# Patient Record
Sex: Female | Born: 1960 | Hispanic: Yes | Smoking: Never smoker
Health system: Southern US, Community
[De-identification: ages and names within clinical notes are randomized; demographics above are authoritative.]

## PROBLEM LIST (undated history)

## (undated) DIAGNOSIS — M545 Low back pain, unspecified: Secondary | ICD-10-CM

## (undated) DIAGNOSIS — N6310 Unspecified lump in the right breast, unspecified quadrant: Secondary | ICD-10-CM

## (undated) DIAGNOSIS — S52629A Torus fracture of lower end of unspecified ulna, initial encounter for closed fracture: Secondary | ICD-10-CM

## (undated) DIAGNOSIS — R7989 Other specified abnormal findings of blood chemistry: Secondary | ICD-10-CM

## (undated) DIAGNOSIS — Z860100 Personal history of colon polyps, unspecified: Secondary | ICD-10-CM

## (undated) DIAGNOSIS — K2970 Gastritis, unspecified, without bleeding: Secondary | ICD-10-CM

## (undated) DIAGNOSIS — J45909 Unspecified asthma, uncomplicated: Secondary | ICD-10-CM

## (undated) DIAGNOSIS — S52529A Torus fracture of lower end of unspecified radius, initial encounter for closed fracture: Secondary | ICD-10-CM

## (undated) DIAGNOSIS — K219 Gastro-esophageal reflux disease without esophagitis: Secondary | ICD-10-CM

## (undated) DIAGNOSIS — A609 Anogenital herpesviral infection, unspecified: Secondary | ICD-10-CM

## (undated) HISTORY — DX: Personal history of colon polyps, unspecified: Z86.0100

## (undated) HISTORY — DX: Gastritis, unspecified, without bleeding: K29.70

## (undated) HISTORY — DX: Gastro-esophageal reflux disease without esophagitis: K21.9

## (undated) HISTORY — DX: Torus fracture of lower end of unspecified radius, initial encounter for closed fracture: S52.529A

## (undated) HISTORY — DX: Torus fracture of lower end of unspecified ulna, initial encounter for closed fracture: S52.629A

## (undated) HISTORY — DX: Other specified abnormal findings of blood chemistry: R79.89

## (undated) HISTORY — DX: Personal history of colonic polyps: Z86.010

## (undated) HISTORY — DX: Unspecified asthma, uncomplicated: J45.909

## (undated) HISTORY — DX: Anogenital herpesviral infection, unspecified: A60.9

---

## 2005-12-27 ENCOUNTER — Ambulatory Visit: Payer: Self-pay

## 2006-08-22 ENCOUNTER — Emergency Department: Payer: Self-pay | Admitting: Emergency Medicine

## 2007-05-23 HISTORY — PX: BACK SURGERY: SHX140

## 2008-03-03 ENCOUNTER — Ambulatory Visit: Payer: Self-pay

## 2009-12-07 ENCOUNTER — Ambulatory Visit: Payer: Self-pay

## 2010-05-22 HISTORY — PX: CHOLECYSTECTOMY: SHX55

## 2011-02-22 ENCOUNTER — Ambulatory Visit: Payer: Self-pay

## 2011-04-15 ENCOUNTER — Emergency Department: Payer: Self-pay | Admitting: Emergency Medicine

## 2011-09-08 ENCOUNTER — Emergency Department: Payer: Self-pay | Admitting: Internal Medicine

## 2011-09-08 LAB — BASIC METABOLIC PANEL
Anion Gap: 10 (ref 7–16)
Calcium, Total: 8.7 mg/dL (ref 8.5–10.1)
Chloride: 104 mmol/L (ref 98–107)
Creatinine: 0.78 mg/dL (ref 0.60–1.30)
Glucose: 117 mg/dL — ABNORMAL HIGH (ref 65–99)
Osmolality: 281 (ref 275–301)
Potassium: 3.5 mmol/L (ref 3.5–5.1)

## 2011-09-08 LAB — CBC
HCT: 40.8 % (ref 35.0–47.0)
MCH: 29.1 pg (ref 26.0–34.0)
MCHC: 32.6 g/dL (ref 32.0–36.0)
MCV: 89 fL (ref 80–100)
RBC: 4.57 10*6/uL (ref 3.80–5.20)
RDW: 12.7 % (ref 11.5–14.5)
WBC: 12.1 10*3/uL — ABNORMAL HIGH (ref 3.6–11.0)

## 2011-09-08 LAB — URINALYSIS, COMPLETE
Bilirubin,UR: NEGATIVE
Glucose,UR: NEGATIVE mg/dL (ref 0–75)
Ph: 7 (ref 4.5–8.0)
Squamous Epithelial: 1

## 2011-09-10 LAB — URINE CULTURE

## 2011-09-23 ENCOUNTER — Emergency Department: Payer: Self-pay | Admitting: Emergency Medicine

## 2011-09-23 LAB — CBC
HCT: 40.8 % (ref 35.0–47.0)
HGB: 13.6 g/dL (ref 12.0–16.0)
MCH: 30.4 pg (ref 26.0–34.0)
MCV: 92 fL (ref 80–100)
Platelet: 276 10*3/uL (ref 150–440)
RDW: 13.4 % (ref 11.5–14.5)

## 2011-09-23 LAB — COMPREHENSIVE METABOLIC PANEL
Albumin: 3.6 g/dL (ref 3.4–5.0)
Anion Gap: 4 — ABNORMAL LOW (ref 7–16)
BUN: 11 mg/dL (ref 7–18)
Bilirubin,Total: 0.3 mg/dL (ref 0.2–1.0)
Chloride: 106 mmol/L (ref 98–107)
Co2: 29 mmol/L (ref 21–32)
Creatinine: 0.68 mg/dL (ref 0.60–1.30)
EGFR (African American): >60
Glucose: 103 mg/dL — ABNORMAL HIGH (ref 65–99)
Osmolality: 277 (ref 275–301)
Potassium: 4.6 mmol/L (ref 3.5–5.1)
SGPT (ALT): 24 U/L
Sodium: 139 mmol/L (ref 136–145)
Total Protein: 8.2 g/dL (ref 6.4–8.2)

## 2011-09-23 LAB — URINALYSIS, COMPLETE
Bilirubin,UR: NEGATIVE
Glucose,UR: NEGATIVE mg/dL (ref 0–75)
Nitrite: NEGATIVE
Protein: NEGATIVE
Squamous Epithelial: NONE SEEN

## 2011-09-23 LAB — PREGNANCY, URINE: Pregnancy Test, Urine: NEGATIVE m[IU]/mL

## 2012-05-22 HISTORY — PX: COLONOSCOPY: SHX174

## 2013-03-19 ENCOUNTER — Ambulatory Visit: Payer: Self-pay

## 2013-05-22 DIAGNOSIS — R7989 Other specified abnormal findings of blood chemistry: Secondary | ICD-10-CM

## 2013-05-22 DIAGNOSIS — J45909 Unspecified asthma, uncomplicated: Secondary | ICD-10-CM

## 2013-05-22 HISTORY — DX: Other specified abnormal findings of blood chemistry: R79.89

## 2013-05-22 HISTORY — DX: Unspecified asthma, uncomplicated: J45.909

## 2013-08-03 ENCOUNTER — Emergency Department: Payer: Self-pay | Admitting: Emergency Medicine

## 2014-01-20 DIAGNOSIS — K2970 Gastritis, unspecified, without bleeding: Secondary | ICD-10-CM

## 2014-01-20 HISTORY — DX: Gastritis, unspecified, without bleeding: K29.70

## 2014-01-20 HISTORY — PX: UPPER GI ENDOSCOPY: SHX6162

## 2014-07-01 ENCOUNTER — Ambulatory Visit: Payer: Self-pay

## 2014-07-14 ENCOUNTER — Ambulatory Visit: Payer: Self-pay

## 2014-08-17 ENCOUNTER — Encounter: Payer: Self-pay | Admitting: *Deleted

## 2014-08-27 ENCOUNTER — Ambulatory Visit (INDEPENDENT_AMBULATORY_CARE_PROVIDER_SITE_OTHER): Payer: PRIVATE HEALTH INSURANCE | Admitting: General Surgery

## 2014-08-27 ENCOUNTER — Encounter: Payer: Self-pay | Admitting: General Surgery

## 2014-08-27 VITALS — BP 126/70 | HR 62 | Resp 12 | Ht 62.0 in | Wt 145.0 lb

## 2014-08-27 DIAGNOSIS — R0789 Other chest pain: Secondary | ICD-10-CM

## 2014-08-27 NOTE — Patient Instructions (Signed)
Aleve 2 tablets twice a day for 2 weeks and Prilosec daily. Autoexamen de [employer]  Chief Executive Officer(Breast Self-Awareness) El autoexamen de mama le permite advertir problemas en la mama con anticipacin, cuando todava son pequeos. Haga un autoexamen de las mamas:   Sprint Nextel Corporationodos los meses, 5 a 7 [address] despus de tener el perodo (Cokedalemenstruacin).  En la misma altura de cada mes si ya no tiene ms su perodo. Busque:  Diferencias entre los senos (tamao, forma o posicin).  Cambios en el tamao o forma del seno.  Sale sangre o lquido por los pezones.  Modificaciones en los pezones (hoyuelos, movimiento del pezn).   Cambio en el color o la textura de la piel (enrojecimiento, reas escamosas). Trate de palpar:   Engrosamientos.  Bultos.  Hoyuelos.  Cualquier otro cambio. CMO REALIZAR EL AUTOEXAMEN DE MAMAS  Observe sus senos y pezones.  1. Qutese toda la ropa por encima de la cintura. 2. Prese frente a un espejo en una habitacin con buena iluminacin. 3.  Ponga las Rockwell Automationmanos en las caderas y [address]je las manos Kellh[place] abajo. Palpe las mamas.  1. Acustese boca arriba o de pie en la ducha o la baera. Si est en la ducha o baera, moje y enjabone sus manos. 2. Coloque el brazo derecho por encima de la cabeza. 3. Coloque la mano izquierda en el rea de la axila derecha. 4. Haga pequeos crculos con las yemas de los dedos (no las puntas) de los 3 dedos medios. Presione ligeramente y luego haga una presin [place] y [place]. 5. Mueva los dedos un poco ms bajo y haga pequeos crculos en 3 presiones (ligera, media y Georgefirme). 6. Siga moviendo los dedos hacia abajo y haciendo crculos [employer] fast streamerhasta llegar a la parte inferior de la mama. 7. Mueva sus dedos de a un dedo de ancho hacia el centro de su cuerpo. 8. Contine haciendo crculos, esta vez hacia arriba hasta llegar a la parte inferior del cuello. 9. Mueva sus dedos de a un dedo de ancho hacia el centro de su cuerpo. 10. Haga crculos hacia abajo a partir de la  parte inferior del cuello. Haga crculos hacia arriba a partir de la parte inferior de la [place] Cornermama. Detngase cuando llegue a la mitad del pecho. 11.  Repita estos pasos en la otra mama. Escriba lo que observe y se sienta normal para cada mama. Tambin anote cualquier cambio que note.  SOLICITE AYUDA DE INMEDIATO SI:   Observa cambios en las mamas o los pezones.  Observa cambios en la piel.  Tiene una secrecin anormal en los pezones.  Usted tiene un bulto nuevo.  Tiene zonas con endurecimientos inusuales. Document Released: 06/10/2010 Document Revised: 04/24/2012 [facility] Patient Information 2015 [place], [place]. This information is not intended to replace advice given to you by your health care provider. Make sure you discuss any questions you have with your health care provider.

## 2014-08-27 NOTE — Progress Notes (Signed)
Patient ID: Victoria RoofRosa Szafran, female   DOB: Oct 20, 1960, 54 y.o.   MRN: [id number]  Chief Complaint  Patient presents with  . Breast Problem    right breast    HPI Victoria Lee is a 54 y.o. female.  who presents for a breast evaluation. The most recent mammogram and ultrasound was done on 07/14/14. She states she has felt a little knot in the lateral right breast since 2003. She does admit to right breast pain, near sternum so much that she avoids wearing a bra. She does not think it has gotten larger over the years. The pain does come and go. 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 Patient does perform regular self breast checks and gets regular mammograms done.   No family history of breast cancer. Interpreter, Annice PihJackie, present for interview, exam and discussion.  HPI  Past Medical History  Diagnosis Date  . [facility]ast[facility] Sept 2015    [facility]    Past Surgical History  Procedure Laterality Date  . Back surgery  2009  . Cholecystectomy  2012  . Colonoscopy  2014  . Upper gi endoscopy  Sept 2015    No family history on file.  Social History History  Substance Use Topics  . Smoking status: Never Smoker   . Smokeless tobacco: Never Used  . Alcohol Use: No    Allergies  Allergen Reactions  . Latex Itching    No current outpatient prescriptions on file.   No current facility-administered medications for this visit.    Review of Systems Review of Systems  Constitutional: Negative.   Respiratory: Positive for cough.   Cardiovascular: Negative.     Blood pressure 126/70, pulse 62, resp. rate 12, height 5\' 2"  (1.575 m), weight 145 lb (65.772 kg).  Physical Exam Physical Exam  Constitutional: She is oriented to person, place, and time. She appears well-developed and well-nourished.  Neck: Neck supple.  Cardiovascular: Normal rate, regular rhythm and normal heart sounds.   Pulmonary/Chest: Effort normal and breath sounds normal. Right breast exhibits tenderness.  Right breast exhibits no inverted nipple, no mass, no nipple discharge and no skin change. Left breast exhibits no inverted nipple, no mass, no nipple discharge, no skin change and no tenderness.    Tenderness over T6 costochondral junction.   Lymphadenopathy:    She has no cervical adenopathy.    She has no axillary adenopathy.  Neurological: She is alert and oriented to person, place, and time.  Skin: Skin is warm and dry.    Data Reviewed 12/30/2009 note for evaluation of breast and axillary pain reviewed.  Mammograms and right breast ultrasound dated 07/14/2014 were reviewed. BI-RADS-1.  Assessment    Benign breast exam. Focal fibrosis/adipose tissue in the lower outer quadrant of the right breast. Chest wall tenderness from a musculoskeletal source.    Plan    The patient reported that she had had a gastric ulcer last year. Review of [facility] endoscopy report report was notable for mild gastritis. No evidence of H. pylori infection. I think she will tolerate a short course of anti-inflammatories if she makes use of Prilosec with this medication. Local heat application was encouraged.    Aleve 2 tablets twice a day for 2 weeks and Priolsec daily.  PCP:  None Ref: Dr. Gaylene Brookshoksi  Juanito Gonyer, Merrily PewJeffrey W 08/28/2014, 9:07 PM

## 2014-08-28 DIAGNOSIS — R0789 Other chest pain: Secondary | ICD-10-CM | POA: Insufficient documentation

## 2018-02-19 ENCOUNTER — Ambulatory Visit: Payer: Self-pay

## 2018-02-28 ENCOUNTER — Ambulatory Visit: Payer: Self-pay

## 2018-03-28 ENCOUNTER — Inpatient Hospital Stay: Attending: Medical

## 2018-03-28 ENCOUNTER — Ambulatory Visit: Admit: 2018-03-28 | Discharge: 2018-03-28 | Attending: Medical

## 2018-03-28 DIAGNOSIS — M25562 Pain in left knee: Secondary | ICD-10-CM

## 2018-03-28 DIAGNOSIS — G8929 Other chronic pain: Secondary | ICD-10-CM

## 2018-03-28 MED ORDER — NAPROXEN 500 MG PO TABS
500 MG | ORAL_TABLET | Freq: Two times a day (BID) | ORAL | 1 refills | Status: AC
Start: 2018-03-28 — End: ?

## 2018-03-28 NOTE — Other (Unsigned)
Patient Acct Nbr: [id number]   Primary AUTH/CERT:   Primary Insurance Company Name: Self Pay  Primary Insurance Plan name: Self Pay  Primary Insurance Group Number:   Primary Insurance Plan Type: Health  Primary Insurance Policy Number:

## 2018-03-28 NOTE — Progress Notes (Signed)
HPI:    Kristina [name] is a 57 y.o. female who presents to Establish. States general health is good. She does not take chronic medications.  Moved from [place] a few months ago, and is living with her daughter.  We are utilizing the [employer] system to communicate.    CC chronic L knee pain x 1 yr with progressively worsening x 3 months. Twisted knee a yr ago while standing on a piece of plastic to wash a window. + swelling noted after injury. Saw physician who advised crutches, rest, medication.She has had intermittent pain/ sensation of buckling since. Feels a bulge at posterior popliteal space.  Denies re injury, over use. 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 She has not worked for 3 weeks and knee sill bothering her. Stair climbing exacerbates pain, feels rubbing grinding sensation over patella    Chief Complaint   Patient presents with   ??? Establish Care     pt needing PCP   ??? Knee Pain     pt is having left knee pain on going 2 months, pt fell on concrete and injured her knee.    ??? Flu Vaccine     denies flu shot        HPI  Subjective:   Review of Systems   Musculoskeletal: Positive for arthralgias ( L knee) and gait problem ( limping, has pain walking stairs, has to hold on to banister). Negative for back pain and joint swelling.        [name] is a 57 y.o. female with the following history as recorded in EpicCare:  There are no active problems to display for this patient.    Current Outpatient Medications   Medication Sig Dispense Refill   ??? naproxen (NAPROSYN) 500 MG tablet Take 1 tablet by mouth 2 times daily (with meals) 60 tablet 1     No current facility-administered medications for this visit.      Allergies: Patient has no known allergies.  History reviewed. No pertinent past medical history.  Past Surgical History:   Procedure Laterality Date   ??? BACK SURGERY  2009   ??? CHOLECYSTECTOMY     ??? HERNIA REPAIR       Family History   Problem Relation Age of  Onset   ??? Other Mother      Social History     Tobacco Use   ??? Smoking status: Never Smoker   ??? Smokeless tobacco: Never Used   Substance Use Topics   ??? Alcohol use: Never     Frequency: Never       Objective:   Physical Exam   Constitutional: She appears well-developed and well-nourished.   Cardiovascular: Normal rate and regular rhythm.   Pulmonary/Chest: Effort normal and breath sounds normal.   Musculoskeletal:        Left knee: She exhibits abnormal alignment ( valgus) and abnormal meniscus ( painful McMurray's). She exhibits no swelling, no effusion, no deformity, no erythema and normal patellar mobility. No medial joint line and no lateral joint line tenderness noted.   No popliteal fullness appreciated         BP 124/78 (Site: Left Upper Arm, Position: Sitting, Cuff Size: Medium Adult)    Pulse 80    Ht 5\' 1"  (1.549 m)    Wt 153 lb (69.4 kg)    SpO2 98%    BMI 28.91 kg/m??  No LMP recorded.    Assessment:  Diagnosis Orders   1. Chronic pain of left knee  XR KNEE LEFT (3 VIEWS)       Plan:     Advised to get x ray and start regular NSAID.  Rx sent to pharmacy- see below.  Allergies reviewed. Advised to take BID with food, stop if noting GI sx, change in stool color.  Rest the knee.  Follow up in 2 weeks.     I spent > 51% total time of 30 minutes coordinating care (counseling patient) and provided discussion regarding the medical problems as noted.    Diagnosis and treatment plan were reviewed with patient. R/B/A of medications were discussed, patient agreed with treatment plan.  The patient's questions were answered, and she verbalized understanding of above instructions.         Everett Ricciardelli A Tyniesha Howald,PA-C

## 2018-03-28 NOTE — Progress Notes (Signed)
The patient, Kristina [name], identity was verified by name. Supervising provider for clinic visit: Lucilyn Bullock PA-C.

## 2018-04-15 ENCOUNTER — Ambulatory Visit: Admit: 2018-04-15 | Discharge: 2018-04-15 | Attending: Medical

## 2018-04-15 DIAGNOSIS — G8929 Other chronic pain: Secondary | ICD-10-CM

## 2018-04-15 DIAGNOSIS — M25562 Pain in left knee: Secondary | ICD-10-CM

## 2018-04-15 NOTE — Progress Notes (Signed)
The patient, Kristina [name], identity was verified by name. Supervising provider for clinic visit: Lucilyn Bullock PA-C.

## 2018-04-15 NOTE — Progress Notes (Addendum)
HPI:    Kristina [name] is a 57 y.o. female who presents for recheck L knee pain. X-ray negative. States Naprosyn bid did not help. She is still limping.    Chief Complaint   Patient presents with   ??? Knee Pain     2 week f/u- pt is wanting results of xray        HPI  Subjective:   Review of Systems   Musculoskeletal: Positive for arthralgias and gait problem. Negative for joint swelling.        Kristina RoofRosa Tiegs is a 57 y.o. female with the following history as recorded in EpicCare:  There are no active problems to display for this patient.    Current Outpatient Medications   Medication Sig Dispense Refill   ??? naproxen (NAPROSYN) 500 MG tablet Take 1 tablet by mouth 2 times daily (with meals) 60 tablet 1     No current facility-administered medications for this visit.      Allergies: Patient has no known allergies.  No past medical history on file.  Past Surgical History:   Procedure Laterality Date   ??? BACK SURGERY  2009   ??? CHOLECYSTECTOMY     ??? HERNIA REPAIR       Family History   Problem Relation Age of Onset   ??? Other Mother      Social History     Tobacco Use   ??? Smoking status: Never Smoker   ??? Smokeless tobacco: Never Used   Substance Use Topics   ??? Alcohol use: Never     Frequency: Never       Objective:   Physical Exam  Constitutional:       Appearance: Normal appearance.   Cardiovascular:      Rate and Rhythm: Normal rate and regular rhythm.      Heart sounds: Normal heart sounds.   Musculoskeletal:      Left knee: She exhibits no effusion.      Comments: Slight limp when walking           BP 122/78 (Site: Right Upper Arm, Position: Sitting, Cuff Size: Medium Adult)    Pulse 78    Ht 5' 0.98" (1.549 m)    Wt 153 lb (69.4 kg)    SpO2 97%    BMI 28.92 kg/m??  No LMP recorded.    Assessment:      Diagnosis Orders   1. Chronic pain of left knee  MRI LOWER EXTREMITY LEFT W JT WO CONTRAST       Plan:     Advised to proceed with MRI to R/O meniscal tear. Advised to consider holding scheduling until she obtains insurance  coverage. She prefers to proceed with scheduling, will file for financial assistance.  Follow up pending.   Continue analgesic of choice PRN      Diagnosis and treatment plan were reviewed with patient. R/B/A of medications were discussed, patient agreed with treatment plan.  The patient's questions were answered, and she verbalized understanding of above instructions.         Nailea Whitehorn A Million Maharaj,PA-C

## 2018-04-16 NOTE — Addendum Note (Signed)
Addended by: Willaim RayasBULLOCK, Louana Fontenot A on: 04/16/2018 01:31 PM     Modules accepted: Level of Service

## 2018-07-18 ENCOUNTER — Ambulatory Visit: Payer: Medicaid Other | Admitting: Family Medicine

## 2018-07-18 VITALS — BP 122/78 | HR 98 | Temp 97.7°F | Ht 59.0 in | Wt 157.8 lb

## 2018-07-18 DIAGNOSIS — Z8719 Personal history of other diseases of the digestive system: Secondary | ICD-10-CM | POA: Insufficient documentation

## 2018-07-18 DIAGNOSIS — M1732 Unilateral post-traumatic osteoarthritis, left knee: Secondary | ICD-10-CM

## 2018-07-18 DIAGNOSIS — Z13220 Encounter for screening for lipoid disorders: Secondary | ICD-10-CM

## 2018-07-18 DIAGNOSIS — Z131 Encounter for screening for diabetes mellitus: Secondary | ICD-10-CM

## 2018-07-18 DIAGNOSIS — S83282D Other tear of lateral meniscus, current injury, left knee, subsequent encounter: Secondary | ICD-10-CM | POA: Insufficient documentation

## 2018-07-18 DIAGNOSIS — S83242D Other tear of medial meniscus, current injury, left knee, subsequent encounter: Secondary | ICD-10-CM

## 2018-07-18 DIAGNOSIS — R7989 Other specified abnormal findings of blood chemistry: Secondary | ICD-10-CM

## 2018-07-18 DIAGNOSIS — Z1159 Encounter for screening for other viral diseases: Secondary | ICD-10-CM

## 2018-07-18 DIAGNOSIS — M1712 Unilateral primary osteoarthritis, left knee: Secondary | ICD-10-CM | POA: Insufficient documentation

## 2018-07-18 MED ORDER — MELOXICAM 7.5 MG PO TABS: 7.5000 mg | ORAL_TABLET | Freq: Every day | ORAL | 3 refills | Status: AC

## 2018-07-18 NOTE — Assessment & Plan Note (Signed)
Not bothering her now, but will be very careful with NSAIDs. Monitor for belly pain.

## 2018-07-18 NOTE — Assessment & Plan Note (Signed)
Initially diagnosed on MRI in 2016- never had follow up. Will start 7.5mg meloxicam and recheck in a couple of weeks, given known pathology that has been getting worse, we will get her into see ortho. Referral generated today.  
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
## 2018-07-18 NOTE — Assessment & Plan Note (Signed)
Initially diagnosed on MRI in 2016- never had follow up. Will start 7.5mg  meloxicam and recheck in a couple of weeks, given known pathology that has been getting worse, we will get her into see ortho. Referral generated today.

## 2018-07-19 LAB — UA/M W/RFLX CULTURE, ROUTINE
BILIRUBIN UA: NEGATIVE
Glucose, UA: NEGATIVE
Ketones, UA: NEGATIVE
Leukocytes, UA: NEGATIVE
Nitrite, UA: NEGATIVE
Protein, UA: NEGATIVE
Specific Gravity, UA: 1.017 (ref 1.005–1.030)
Urobilinogen, Ur: 0.2 mg/dL (ref 0.2–1.0)
pH, UA: 5 (ref 5.0–7.5)

## 2018-07-19 LAB — CBC WITH DIFFERENTIAL/PLATELET
Basophils Absolute: 0.1 10*3/uL (ref 0.0–0.2)
Basos: 1 %
EOS (ABSOLUTE): 0.3 10*3/uL (ref 0.0–0.4)
EOS: 2 %
HEMATOCRIT: 41.2 % (ref 34.0–46.6)
HEMOGLOBIN: 14.6 g/dL (ref 11.1–15.9)
Immature Grans (Abs): 0 10*3/uL (ref 0.0–0.1)
Immature Granulocytes: 0 %
LYMPHS ABS: 4.2 10*3/uL — AB (ref 0.7–3.1)
Lymphs: 38 %
MCH: 30.9 pg (ref 26.6–33.0)
MCHC: 35.4 g/dL (ref 31.5–35.7)
MCV: 87 fL (ref 79–97)
Monocytes Absolute: 0.6 10*3/uL (ref 0.1–0.9)
Monocytes: 5 %
Neutrophils Absolute: 5.8 10*3/uL (ref 1.4–7.0)
Neutrophils: 54 %
Platelets: 310 10*3/uL (ref 150–450)
RBC: 4.73 x10E6/uL (ref 3.77–5.28)
RDW: 12.2 % (ref 11.7–15.4)
WBC: 10.9 10*3/uL — AB (ref 3.4–10.8)

## 2018-07-19 LAB — COMPREHENSIVE METABOLIC PANEL
ALK PHOS: 100 IU/L (ref 39–117)
ALT: 14 IU/L (ref 0–32)
AST: 15 IU/L (ref 0–40)
Albumin/Globulin Ratio: 1.4 (ref 1.2–2.2)
Albumin: 4.5 g/dL (ref 3.8–4.9)
BUN/Creatinine Ratio: 12 (ref 9–23)
BUN: 12 mg/dL (ref 6–24)
Bilirubin Total: 0.2 mg/dL (ref 0.0–1.2)
CO2: 25 mmol/L (ref 20–29)
Calcium: 9.7 mg/dL (ref 8.7–10.2)
Chloride: 101 mmol/L (ref 96–106)
Creatinine, Ser: 1.01 mg/dL — ABNORMAL HIGH (ref 0.57–1.00)
GFR calc Af Amer: 71 mL/min/{1.73_m2} (ref >59)
GFR calc non Af Amer: 62 mL/min/{1.73_m2} (ref >59)
Globulin, Total: 3.2 g/dL (ref 1.5–4.5)
Glucose: 97 mg/dL (ref 65–99)
Potassium: 4.7 mmol/L (ref 3.5–5.2)
Sodium: 143 mmol/L (ref 134–144)
Total Protein: 7.7 g/dL (ref 6.0–8.5)

## 2018-07-19 LAB — MICROSCOPIC EXAMINATION
Bacteria, UA: NONE SEEN
Casts: NONE SEEN /lpf

## 2018-07-19 LAB — LIPID PANEL W/O CHOL/HDL RATIO
Cholesterol, Total: 270 mg/dL — ABNORMAL HIGH (ref 100–199)
HDL: 49 mg/dL (ref >39)
LDL Calculated: 174 mg/dL — ABNORMAL HIGH (ref 0–99)
Triglycerides: 236 mg/dL — ABNORMAL HIGH (ref 0–149)
VLDL Cholesterol Cal: 47 mg/dL — ABNORMAL HIGH (ref 5–40)

## 2018-07-19 LAB — HEPATITIS C ANTIBODY: Hep C Virus Ab: 0.1 s/co ratio (ref 0.0–0.9)

## 2018-07-19 LAB — HEMOGLOBIN A1C
Est. average glucose Bld gHb Est-mCnc: 117 mg/dL
Hgb A1c MFr Bld: 5.7 % — ABNORMAL HIGH (ref 4.8–5.6)

## 2018-07-19 LAB — TSH: TSH: 6.57 u[IU]/mL — ABNORMAL HIGH (ref 0.450–4.500)

## 2018-08-13 ENCOUNTER — Ambulatory Visit: Payer: Medicaid Other

## 2018-08-15 ENCOUNTER — Ambulatory Visit: Payer: Medicaid Other | Admitting: Ophthalmology

## 2018-09-03 ENCOUNTER — Ambulatory Visit: Payer: Medicaid Other | Admitting: Urology

## 2019-04-03 ENCOUNTER — Other Ambulatory Visit: Payer: Self-pay

## 2019-04-03 ENCOUNTER — Encounter: Payer: Self-pay | Admitting: Urology

## 2019-04-03 ENCOUNTER — Ambulatory Visit: Payer: Medicaid Other | Admitting: Urology

## 2019-04-03 VITALS — BP 163/82 | HR 78 | Temp 97.2°F | Ht 59.06 in | Wt 153.0 lb

## 2019-04-03 DIAGNOSIS — Z13220 Encounter for screening for lipoid disorders: Secondary | ICD-10-CM

## 2019-04-03 DIAGNOSIS — Z131 Encounter for screening for diabetes mellitus: Secondary | ICD-10-CM

## 2019-04-03 DIAGNOSIS — M545 Low back pain, unspecified: Secondary | ICD-10-CM

## 2019-04-03 DIAGNOSIS — R3129 Other microscopic hematuria: Secondary | ICD-10-CM

## 2019-04-03 NOTE — Progress Notes (Signed)
  Patient: Victoria Lee Female    DOB: [birth year]   58 y.o.   MRN: [id number] Visit Date: 04/03/2019  Today's Provider: Zara Council, PA-C   Chief Complaint  Patient presents with  . Back Pain  . Knee Pain   Subjective:    HPI   Breast cyst seen on mammogram in 2016 - needs follow up mammogram  2008 in [place] she had a back operation for a ?bulging disc in lumbar area - her right leg was dragging - can no longer bend at the waist or squat - no loss of bladder/bowel function   2012 in [place] ? 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 Latex Itching   Previous Medications   MELOXICAM (MOBIC) 7.5 MG TABLET    Take 1 tablet (7.5 mg total) by mouth daily.    Review of Systems  Social History   Tobacco Use  . Smoking status: Never Smoker  . Smokeless tobacco: Never Used  Substance Use Topics  . Alcohol use: No    Alcohol/week: 0.0 standard drinks   Objective:   BP (!) 163/82   Pulse 78   Temp (!) 97.2 F (36.2 C)   Ht 4' 11.06" (1.5 m)   Wt 153 lb (69.4 kg)   SpO2 100%   BMI 30.84 kg/m   Physical Exam Constitutional:  Well nourished. Alert and oriented, No acute distress. HEENT: [place] AT, moist mucus membranes.  Trachea midline, no masses. Cardiovascular: No clubbing, cyanosis, or edema. Respiratory: Normal respiratory effort, no increased work of breathing. Neurologic: Grossly intact, no focal deficits, moving all 4 extremities.  Unable to bend to touch her toes  Psychiatric: Normal mood and affect.      Assessment & Plan:     1. Back pain Refer to [facility] ortho  2. Cysts in breasts Need mammogram  3. Incontinence Schedule appointment to discuss   4. Microscopic hematuria  3-10 RBC's in 06/2018 will recheck UA tonight         Zara Council, PA-C   [facility]

## 2019-04-04 LAB — URINALYSIS, COMPLETE
Bilirubin, UA: NEGATIVE
Glucose, UA: NEGATIVE
Ketones, UA: NEGATIVE
Leukocytes,UA: NEGATIVE
Nitrite, UA: NEGATIVE
Protein,UA: NEGATIVE
RBC, UA: NEGATIVE
Specific Gravity, UA: 1.02 (ref 1.005–1.030)
Urobilinogen, Ur: 0.2 mg/dL (ref 0.2–1.0)
pH, UA: 7 (ref 5.0–7.5)

## 2019-04-04 LAB — MICROSCOPIC EXAMINATION
Bacteria, UA: NONE SEEN
Casts: NONE SEEN /lpf
RBC, Urine: NONE SEEN /hpf (ref 0–2)

## 2019-04-04 LAB — COMPREHENSIVE METABOLIC PANEL
ALT: 12 IU/L (ref 0–32)
AST: 17 IU/L (ref 0–40)
Albumin/Globulin Ratio: 1.4 (ref 1.2–2.2)
Albumin: 4.3 g/dL (ref 3.8–4.9)
Alkaline Phosphatase: 98 IU/L (ref 39–117)
BUN/Creatinine Ratio: 20 (ref 9–23)
BUN: 13 mg/dL (ref 6–24)
Bilirubin Total: 0.2 mg/dL (ref 0.0–1.2)
CO2: 26 mmol/L (ref 20–29)
Calcium: 9.2 mg/dL (ref 8.7–10.2)
Chloride: 103 mmol/L (ref 96–106)
Creatinine, Ser: 0.66 mg/dL (ref 0.57–1.00)
GFR calc Af Amer: 113 mL/min/{1.73_m2} (ref >59)
GFR calc non Af Amer: 98 mL/min/{1.73_m2} (ref >59)
Globulin, Total: 3.1 g/dL (ref 1.5–4.5)
Glucose: 95 mg/dL (ref 65–99)
Potassium: 3.9 mmol/L (ref 3.5–5.2)
Sodium: 140 mmol/L (ref 134–144)
Total Protein: 7.4 g/dL (ref 6.0–8.5)

## 2019-04-04 LAB — LIPID PANEL
Chol/HDL Ratio: 4.8 ratio — ABNORMAL HIGH (ref 0.0–4.4)
Cholesterol, Total: 226 mg/dL — ABNORMAL HIGH (ref 100–199)
HDL: 47 mg/dL (ref >39)
LDL Chol Calc (NIH): 150 mg/dL — ABNORMAL HIGH (ref 0–99)
Triglycerides: 162 mg/dL — ABNORMAL HIGH (ref 0–149)
VLDL Cholesterol Cal: 29 mg/dL (ref 5–40)

## 2019-04-04 LAB — CBC WITH DIFFERENTIAL/PLATELET
Basophils Absolute: 0.1 10*3/uL (ref 0.0–0.2)
Basos: 1 %
EOS (ABSOLUTE): 0.2 10*3/uL (ref 0.0–0.4)
Eos: 2 %
Hematocrit: 43.6 % (ref 34.0–46.6)
Hemoglobin: 14.4 g/dL (ref 11.1–15.9)
Immature Grans (Abs): 0 10*3/uL (ref 0.0–0.1)
Immature Granulocytes: 0 %
Lymphocytes Absolute: 3.5 10*3/uL — ABNORMAL HIGH (ref 0.7–3.1)
Lymphs: 34 %
MCH: 29.3 pg (ref 26.6–33.0)
MCHC: 33 g/dL (ref 31.5–35.7)
MCV: 89 fL (ref 79–97)
Monocytes Absolute: 0.7 10*3/uL (ref 0.1–0.9)
Monocytes: 6 %
Neutrophils Absolute: 5.8 10*3/uL (ref 1.4–7.0)
Neutrophils: 57 %
Platelets: 291 10*3/uL (ref 150–450)
RBC: 4.91 x10E6/uL (ref 3.77–5.28)
RDW: 12.4 % (ref 11.7–15.4)
WBC: 10.3 10*3/uL (ref 3.4–10.8)

## 2019-04-04 LAB — HEMOGLOBIN A1C
Est. average glucose Bld gHb Est-mCnc: 120 mg/dL
Hgb A1c MFr Bld: 5.8 % — ABNORMAL HIGH (ref 4.8–5.6)

## 2019-04-04 LAB — TSH: TSH: 2.57 u[IU]/mL (ref 0.450–4.500)

## 2019-04-24 ENCOUNTER — Ambulatory Visit: Payer: Medicaid Other | Admitting: Urology

## 2019-04-24 ENCOUNTER — Other Ambulatory Visit: Payer: Self-pay

## 2019-04-24 VITALS — BP 142/81 | HR 82 | Temp 97.1°F | Ht 61.0 in | Wt 155.0 lb

## 2019-04-24 DIAGNOSIS — M545 Low back pain, unspecified: Secondary | ICD-10-CM

## 2019-04-24 DIAGNOSIS — Z1231 Encounter for screening mammogram for malignant neoplasm of breast: Secondary | ICD-10-CM

## 2019-04-24 DIAGNOSIS — Z124 Encounter for screening for malignant neoplasm of cervix: Secondary | ICD-10-CM

## 2019-04-24 MED ORDER — MIRABEGRON ER 25 MG PO TB24: 25.0000 mg | ORAL_TABLET | Freq: Every day | ORAL | 2 refills | Status: DC

## 2019-04-24 NOTE — Progress Notes (Signed)
  Patient: Victoria Lee Female    DOB: [birth year]   58 y.o.   MRN: [id number] Visit Date: 04/24/2019  Today's Provider: Zara Council, PA-C   Chief Complaint  Patient presents with  . Follow-up   Subjective:    HPI Discussed labs  Having symptoms of incontinence - urge incontinence - wearing pads     Allergies  Allergen Reactions  . Latex Itching   Previous Medications   MELOXICAM (MOBIC) 7.5 MG TABLET    Take 1 tablet (7.5 mg total) by mouth daily.    Review of Systems  Social History   Tobacco Use  . Smoking status: Never Smoker  . Smokeless tobacco: Never Used  Substance Use Topics  . Alcohol use: No    Alcohol/week: 0.0 standard drinks   Objective:   BP (!) 142/81   Pulse 82   Temp (!) 97.1 F (36.2 C)   Ht 5\' 1"  (1.549 m)   Wt 155 lb (70.3 kg)   SpO2 100%   BMI 29.29 kg/m   Physical Exam Constitutional:  Well nourished. Alert and oriented, No acute distress. HEENT: [place] AT, moist mucus membranes.  Trachea midline, no masses. Cardiovascular: No clubbing, cyanosis, or edema. Respiratory: Normal respiratory effort, no increased work of breathing. Neurologic: Grossly intact, no focal deficits, moving all 4 extremities. Psychiatric: Normal mood and affect.      Assessment & Plan:   1. Prediabetic  - discussed diet - she is mostly eating healthy meals, but encouraged healthier snacks  - discussed exercise - causes back/knee pain to exercise, will refer to [facility] clinic as she is using resistance bands and trying to be active   2. History of hematuria   - repeated UA negative for AMH   3. Cysts in breasts   - needs mammogram  4. Incontinence  - prescribed Myrbetriq 25 mg daily - unsure if able to get patient assistance for this medication, but Medication Management may be able to help or offer a different OAB medication       Zara Council, PA-C   [facility]

## 2019-05-29 ENCOUNTER — Telehealth: Payer: Self-pay | Admitting: Urology

## 2019-05-29 ENCOUNTER — Other Ambulatory Visit: Payer: Self-pay

## 2019-05-29 ENCOUNTER — Ambulatory Visit: Payer: Medicaid Other | Admitting: Urology

## 2019-05-29 DIAGNOSIS — Z13220 Encounter for screening for lipoid disorders: Secondary | ICD-10-CM

## 2019-05-29 DIAGNOSIS — Z1231 Encounter for screening mammogram for malignant neoplasm of breast: Secondary | ICD-10-CM

## 2019-05-29 DIAGNOSIS — Z131 Encounter for screening for diabetes mellitus: Secondary | ICD-10-CM

## 2019-05-29 DIAGNOSIS — N3946 Mixed incontinence: Secondary | ICD-10-CM

## 2019-05-29 NOTE — Progress Notes (Signed)
  Patient: Victoria Lee Female    DOB: 06/05/1960   59 y.o.   MRN: [id number] Visit Date: 05/29/2019  Today's Provider: Michiel Cowboy, PA-C   No chief complaint on file.  Subjective:    HPI  Victoria Lee is a 59 year old female with incontinence, pre-diabetic, history of hematuria, cysts in breast who is contacted by telephone with the assistance of interpreter services, Eder # [phone]  Incontinence Hasn't heard from medication management.    Pre-diabetic No recent labs.    History of hematuria No reports of gross hematuria.   Cysts in breast Was told that it would be January before she can have a mammogram.    She is having left arm pain that feels like an insect has stung her.  The arm goes numb. She works in [employer].    She is also having right sided blurred vision.  [facility] in [place] stated she needed follow up for her eyes regarding spots.     Allergies  Allergen Reactions  . Latex Itching   Previous Medications   MELOXICAM (MOBIC) 7.5 MG TABLET    Take 1 tablet (7.5 mg total) by mouth daily.   MIRABEGRON ER (MYRBETRIQ) 25 MG TB24 TABLET    Take 1 tablet (25 mg total) by mouth daily.    Review of Systems  Social History   Tobacco Use  . Smoking status: Never Smoker  . Smokeless tobacco: Never Used  Substance Use Topics  . Alcohol use: No    Alcohol/week: 0.0 standard drinks   Objective:   There were no vitals taken for this visit.  Physical Exam She does not sound distressed.  She is answering questions appropriately.       Assessment & Plan:   1. Incontinence Has not received the Myrbetriq Will call Medication Management in the am  2. Pre-diabetes No recent labs  Will get labs prior to next appointment  3. History of hematuria No reports of gross hematuria  4. Cysts in breasts Still needs mammogram Encouraged patient to call mammogram clinic in the morning  5. Arm pain Likely MSK Will address at appointment  6. Right eye  blurred vision Will obtain records from [facility]     Michiel Cowboy, PA-C   [facility]

## 2019-05-29 NOTE — Telephone Encounter (Signed)
Would you check with Medication Management regarding her Myrbetriq prescription? And she needs a follow up appointment with labs prior.

## 2019-06-03 ENCOUNTER — Other Ambulatory Visit: Payer: Self-pay

## 2019-06-03 ENCOUNTER — Ambulatory Visit: Payer: Medicaid Other | Admitting: Specialist

## 2019-06-03 DIAGNOSIS — M545 Low back pain, unspecified: Secondary | ICD-10-CM
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
## 2019-06-11 ENCOUNTER — Other Ambulatory Visit: Payer: Self-pay

## 2019-06-11 ENCOUNTER — Other Ambulatory Visit: Payer: Medicaid Other

## 2019-06-11 DIAGNOSIS — Z13220 Encounter for screening for lipoid disorders: Secondary | ICD-10-CM

## 2019-06-12 LAB — LIPID PANEL
Chol/HDL Ratio: 5.1 ratio — ABNORMAL HIGH (ref 0.0–4.4)
Cholesterol, Total: 229 mg/dL — ABNORMAL HIGH (ref 100–199)
HDL: 45 mg/dL (ref >39)
LDL Chol Calc (NIH): 140 mg/dL — ABNORMAL HIGH (ref 0–99)
Triglycerides: 243 mg/dL — ABNORMAL HIGH (ref 0–149)
VLDL Cholesterol Cal: 44 mg/dL — ABNORMAL HIGH (ref 5–40)

## 2019-06-12 LAB — HEMOGLOBIN A1C
Est. average glucose Bld gHb Est-mCnc: 117 mg/dL
Hgb A1c MFr Bld: 5.7 % — ABNORMAL HIGH (ref 4.8–5.6)

## 2019-06-18 ENCOUNTER — Ambulatory Visit: Payer: Medicaid Other | Admitting: Gerontology

## 2019-06-18 ENCOUNTER — Encounter: Payer: Self-pay | Admitting: Gerontology

## 2019-06-18 ENCOUNTER — Other Ambulatory Visit: Payer: Self-pay

## 2019-06-18 VITALS — BP 147/72 | HR 73 | Temp 96.9°F | Ht 62.0 in | Wt 154.1 lb

## 2019-06-18 DIAGNOSIS — N6009 Solitary cyst of unspecified breast: Secondary | ICD-10-CM

## 2019-06-18 DIAGNOSIS — R399 Unspecified symptoms and signs involving the genitourinary system: Secondary | ICD-10-CM

## 2019-06-18 DIAGNOSIS — R7303 Prediabetes: Secondary | ICD-10-CM

## 2019-06-18 DIAGNOSIS — N3946 Mixed incontinence: Secondary | ICD-10-CM

## 2019-06-18 DIAGNOSIS — E785 Hyperlipidemia, unspecified: Secondary | ICD-10-CM | POA: Insufficient documentation

## 2019-06-18 DIAGNOSIS — I10 Essential (primary) hypertension: Secondary | ICD-10-CM | POA: Insufficient documentation

## 2019-06-18 MED ORDER — MIRABEGRON ER 25 MG PO TB24: 25.0000 mg | ORAL_TABLET | Freq: Every day | ORAL | 2 refills | Status: AC

## 2019-06-18 NOTE — Progress Notes (Signed)
Established Patient Office Visit  Subjective:  Patient ID: Victoria Lee, female    DOB: Jan 27, 1961  Age: 59 y.o. MRN: [id number]  CC:  Chief Complaint  Patient presents with  . Follow-up    HPI [facility] presents for follow up of urinary incontinence , Prediabetes and lab review. She has a history of cyst to her breast and has Mammogram scheduled on 06/24/2019. She also states that she continues to experience urinary incontinence and has not picked up Myrbetriq from pharmacy because of cost.  She admits to experiencing urinary frequency, flank pain and states that her urine smells. Her blood pressure is elevated, she doesn't check her blood pressure at home, states that she adheres to DASH diet.  Her HgbA1c done on 06/11/2019 decreased from 5.8% to 5.7% and her lipid panel, total cholesterol increased from 226 - 229 mg/dl, Triglycerides increased from 162-243, LDL decreased from 150 - 140 mg/dl. She denies chest pain, palpitation, light headedness, fever and chills. Overall, she states that she's doing well and offers no further complaint.  Past Medical History:  Diagnosis Date  . Abnormal thyroid blood test 2015   TSH 7.31  . Gastritis Sept 2015   [facility]  . GERD (gastroesophageal reflux disease)   . HSV (herpes simplex virus) anogenital infection   . Hx of colonic polyps   . Reactive airway disease 2015  . Torus fracture of distal ends of radius and ulna     Past Surgical History:  Procedure Laterality Date  . BACK SURGERY  2009  . CHOLECYSTECTOMY  2012  . COLONOSCOPY  2014  . UPPER GI ENDOSCOPY  Sept 2015    No family history on file.  Social History   Socioeconomic History  . Marital status: Divorced    Spouse name: Not on file  . Number of children: Not on file  . Years of education: Not on file  . Highest education level: Not on file  Occupational History  . Not on file  Tobacco Use  . Smoking status: Never Smoker  . Smokeless tobacco: Never Used  Substance and  Sexual Activity  . Alcohol use: No    Alcohol/week: 0.0 standard drinks  . Drug use: No  . Sexual activity: Not on file  Other Topics Concern  . Not on file  Social History Narrative  . Not on file   Social Determinants of Health   Financial Resource Strain:   . Difficulty of Paying Living Expenses: Not on file  Food Insecurity:   . Worried About Programme researcher, broadcasting/film/video in the Last Year: Not on file  . Ran Out of Food in the Last Year: Not on file  Transportation Needs:   . Lack of Transportation (Medical): Not on file  . Lack of Transportation (Non-Medical): Not on file  Physical Activity:   . Days of Exercise per Week: Not on file  . Minutes of Exercise per Session: Not on file  Stress:   . Feeling of Stress : Not on file  Social Connections:   . Frequency of Communication with Friends and Family: Not on file  . Frequency of Social Gatherings with Friends and Family: Not on file  . Attends Religious Services: Not on file  . Active Member of Clubs or Organizations: Not on file  . Attends Banker Meetings: Not on file  . Marital Status: Not on file  Intimate Partner Violence:   . Fear of Current or Ex-Partner: Not on file  .  Emotionally Abused: Not on file  . Physically Abused: Not on file  . Sexually Abused: Not on file    Outpatient Medications Prior to Visit  Medication Sig Dispense Refill  . meloxicam (MOBIC) 7.5 MG tablet Take 1 tablet (7.5 mg total) by mouth daily. (Patient not taking: Reported on 06/18/2019) 30 tablet 3  . mirabegron ER (MYRBETRIQ) 25 MG TB24 tablet Take 1 tablet (25 mg total) by mouth daily. (Patient not taking: Reported on 06/18/2019) 30 tablet 2   No facility-administered medications prior to visit.    Allergies  Allergen Reactions  . Latex Itching    ROS Review of Systems  Constitutional: Negative.   Respiratory: Negative.   Cardiovascular: Negative.   Genitourinary: Positive for flank pain, frequency and urgency. Negative  for dysuria and hematuria.  Skin: Negative.   Neurological: Negative.   Psychiatric/Behavioral: Negative.       Objective:    Physical Exam  Constitutional: She is oriented to person, place, and time. She appears well-developed.  HENT:  Head: Normocephalic and atraumatic.  Eyes: Pupils are equal, round, and reactive to light. EOM are normal.  Cardiovascular: Normal rate and regular rhythm.  Pulmonary/Chest: Effort normal and breath sounds normal.  Abdominal: There is CVA tenderness.  Neurological: She is alert and oriented to person, place, and time.  Skin: Skin is warm and dry.  Psychiatric: She has a normal mood and affect. Her behavior is normal. Judgment and thought content normal.    BP (!) 147/72   Pulse 73   Temp (!) 96.9 F (36.1 C)   Ht 5\' 2"  (1.575 m)   Wt 154 lb 1.6 oz (69.9 kg)   SpO2 100%   BMI 28.19 kg/m  Wt Readings from Last 3 Encounters:  06/18/19 154 lb 1.6 oz (69.9 kg)  04/24/19 155 lb (70.3 kg)  04/03/19 153 lb (69.4 kg)   She lost 1 pound in in 6 weeks and was advised to continue on her weight loss regimen.  Health Maintenance Due  Topic Date Due  . HIV Screening  02/04/1976  . TETANUS/TDAP  02/04/1980  . PAP SMEAR-Modifier  02/03/1982  . COLONOSCOPY  02/04/2011  . MAMMOGRAM  07/14/2016  . INFLUENZA VACCINE  12/21/2018    There are no preventive care reminders to display for this patient.  Lab Results  Component Value Date   TSH 2.570 04/03/2019   Lab Results  Component Value Date   WBC 10.3 04/03/2019   HGB 14.4 04/03/2019   HCT 43.6 04/03/2019   MCV 89 04/03/2019   PLT 291 04/03/2019   Lab Results  Component Value Date   NA 140 04/03/2019   K 3.9 04/03/2019   CO2 26 04/03/2019   GLUCOSE 95 04/03/2019   BUN 13 04/03/2019   CREATININE 0.66 04/03/2019   BILITOT 0.2 04/03/2019   ALKPHOS 98 04/03/2019   AST 17 04/03/2019   ALT 12 04/03/2019   PROT 7.4 04/03/2019   ALBUMIN 4.3 04/03/2019   CALCIUM 9.2 04/03/2019   ANIONGAP  4 (L) 09/23/2011   Lab Results  Component Value Date   CHOL 229 (H) 06/11/2019   Lab Results  Component Value Date   HDL 45 06/11/2019   Lab Results  Component Value Date   LDLCALC 140 (H) 06/11/2019   Lab Results  Component Value Date   TRIG 243 (H) 06/11/2019   Lab Results  Component Value Date   CHOLHDL 5.1 (H) 06/11/2019   Lab Results  Component Value Date  HGBA1C 5.7 (H) 06/11/2019      Assessment & Plan:   1. Mixed stress and urge urinary incontinence - She was advised to renew her eligibility with [facility] so to pick up her medication - mirabegron ER (MYRBETRIQ) 25 MG TB24 tablet; Take 1 tablet (25 mg total) by mouth daily.  Dispense: 30 tablet; Refill: 2  2. Urinary tract infection symptoms -Urine sample was collected to rule out Urinary infection. - UA/M w/rflx Culture, Routine; Future - 3. Elevated lipids - Her LDL was 140 mg/dl, her ASCVD score is 4.6% and she declines statin therapy. She was advised to continue on low fat/low cholesterol diet and exercise as tolerated.  4. Essential hypertension - She has stage 2 HTN, she declines medication therapy, states that she will modify her diet. She was advised to continue on DASH diet, exercise as tolerated and check her blood pressure at home or Pharmacy.  5. Prediabetes - Her HgbA1c was 5.7% and she was advised to continue on low carbohydrate/ none concentrated sweet diet, and exercise as tolerated.  6. Cyst of breast, unspecified laterality - She was advised to follow up with her Mammogram on 06/24/2019.     Follow-up: Return if symptoms worsen or fail to improve. Ms. Fishburn has no follow up appointment because she is not eligible to receive care at [facility]. She was given phone numbers to call [facility] or [facility] clinic and schedule appointment.   Jovonni Borquez Trellis Paganini, NP

## 2019-06-18 NOTE — Patient Instructions (Signed)
Recuento de carbohidratos para la diabetes mellitus en los adultos Carbohydrate Counting for Diabetes Mellitus, Adult  El recuento de carbohidratos es un mtodo para llevar un registro de la cantidad de carbohidratos que se ingieren. La ingesta natural de carbohidratos aumenta la cantidad de azcar (glucosa) en la sangre. El recuento de la cantidad de carbohidratos que se ingieren sirve para que el nivel de glucosa en sangre permanezca dentro de los lmites normales, lo que ayuda a mantener la diabetes (diabetes mellitus) bajo control. Es importante saber la cantidad de carbohidratos que se pueden ingerir en cada comida sin correr ningn riesgo. Esto es diferente en cada persona. Un especialista en alimentacin y nutricin (nutricionista certificado) puede ayudarlo a crear un plan de alimentacin y a calcular la cantidad de carbohidratos que debe ingerir en cada comida y colacin. Los siguientes alimentos incluyen carbohidratos:  Granos, como panes y cereales.  Frijoles secos y productos con soja.  Verduras con almidn, como papas, guisantes y maz.  Frutas y jugos de frutas.  Leche y yogur.  Dulces y colaciones, como pasteles, galletas, caramelos, papas fritas de bolsa y refrescos. Cmo se calculan los carbohidratos? Hay dos maneras de calcular los carbohidratos de los alimentos. Puede usar cualquiera de los dos mtodos o una combinacin de ambos. Leer la etiqueta de "informacin nutricional" de los alimentos envasados La lista de "informacin nutricional" est incluida en las etiquetas de casi todas las bebidas y los alimentos envasados de los [place]. Incluye lo siguiente:  El tamao de la porcin.  Informacin sobre los nutrientes de cada porcin, incluidos los gramos (g) de carbohidratos por porcin. Para usar la "informacin nutricional":  Decida cuntas porciones va a comer.  Multiplique la cantidad de porciones por el nmero de carbohidratos por porcin.  El resultado  es la cantidad total de carbohidratos que comer. Conocer los tamaos de las porciones estndar de otros alimentos Cuando coma alimentos que contengan carbohidratos y que no estn envasados o no incluyan la "informacin nutricional" en la etiqueta, debe medir las porciones para poder calcular la cantidad de carbohidratos:  Mida los alimentos que comer con una balanza de alimentos o una taza medidora, si es necesario.  Decida cuntas porciones de tamao estndar comer.  Multiplique el nmero de porciones por15. La mayora de los alimentos con alto contenido de carbohidratos contienen unos 15g de carbohidratos por porcin. ? Por ejemplo, si come 8onzas (170g) de fresas, habr comido 2porciones y 30g de carbohidratos (2porciones x 15g=30g).  En el caso de las comidas que contienen mezclas de ms de un alimento, como las sopas y los guisos, debe calcular los carbohidratos de cada alimento que se incluye. La siguiente lista contiene los tamaos de porciones estndar de los alimentos ricos en carbohidratos ms comunes. Cada una de estas porciones tiene aproximadamente 15g de carbohidratos:  pan de hamburguesa o muffin ingls.  onza (15ml) de jarabe.   onza (14g) de mermelada.  1rebanada de pan.  1tortilla de seis pulgadas.  3onzas (85g) de arroz o pasta cocidos.  4onzas (113g) de frijoles secos cocidos.  4onzas (113g) de verduras con almidn, como guisantes, maz o papas.  4onzas (113g) de cereal caliente.  4 onzas (113g) de pur de papas o de una papa grande al horno.  4onzas (113g) de frutas en lata o congeladas.  4onzas (120ml) de jugo de frutas.  4a 6galletas.  6croquetas de pollo.  6onzas (170g) de cereales secos sin azcar.  6onzas (170g) de yogur descremado sin ningn agregado o de yogur   endulzado con edulcorante artificial.  8onzas ( ) de [place].  8 onzas (170g) de frutas frescas o una fruta pequea.  24 onzas  (680g) de palomitas de maz. Ejemplo de recuento de carbohidratos Ejemplo de comida  3 onzas (85g) de pechugas de pollo.  6onzas (170g) de arroz integral.  4onzas (113g) de maz.  8onzas ( ) de leche.  8onzas (170g) de fresas con crema batida sin azcar. Clculo de carbohidratos 1. Identifique los alimentos que contienen carbohidratos: ? Arroz. ? Maz. ? Leche. ? Jinny Sanders. 2. Calcule cuntas porciones come de cada alimento: ? 2 porciones de arroz. ? 1 porcin de maz. ? 1 porcin de leche. ? 1 porcin de fresas. 3. Multiplique cada nmero de porciones por 15g: ? 2 porciones de arroz x 15 g = 30 g. ? 1 porcin de maz x 15 g = 15 g. ? 1 porcin de leche x 15 g = 15 g. ? 1 porcin de fresas x 15 g = 15 g. 4. Sume todas las cantidades para conocer el total de gramos de carbohidratos consumidos: ? 30g + 15g + 15g + 15g = 75g de carbohidratos en total. Resumen  El recuento de carbohidratos es un mtodo para llevar un registro de la cantidad de carbohidratos que se ingieren.  La ingesta natural de carbohidratos aumenta la cantidad de azcar (glucosa) en la sangre.  El recuento de la cantidad de carbohidratos que se ingieren sirve para Futures trader de glucosa en sangre dentro de los lmites [place], lo que ayuda a Pharmacologist la diabetes bajo control.  Un especialista en alimentacin y nutricin (nutricionista certificado) puede ayudarlo a crear un plan de alimentacin y a calcular la cantidad de carbohidratos que debe ingerir en cada comida y colacin. Esta informacin no tiene Theme park manager el consejo del mdico. Asegrese de hacerle al mdico cualquier pregunta que tenga. Document Revised: 02/27/2017 Document Reviewed: 10/20/2015 Elsevier Patient Education  2020 [employer]. Plan de alimentacin restringido en grasas y colesterol Fat and Cholesterol Restricted Eating Plan El exceso de grasas y colesterol en la dieta puede causar problemas de  [place]. Elegir los alimentos adecuados ayuda a [employer] niveles de grasas y [place]. Esto puede evitarle contraer ciertas enfermedades. El mdico puede recomendarle un plan de alimentacin que incluya lo siguiente:  Grasas totales: ______% o menos del total de caloras por da.  Grasas saturadas: ______% o menos del total de caloras por da.  Colesterol: menos de _________mg Karie Chimera.  Fibra: ______g Karie Chimera. Cules son algunos consejos para seguir este plan? Planificacin de las comidas  En las comidas, [place] su plato en cuatro partes iguales: ? Llene la mitad del plato con verduras y ensaladas de hojas verdes. ? Llene un cuarto del plato con cereales integrales. ? Llene un cuarto del plato con alimentos con protenas con bajo contenido de grasas [employer] Corporation).  Coma pescado con alto contenido de grasas omega3 al [employer] veces por semana. Esas grasas se encuentran en la caballa, el atn, las sardinas y el salmn.  Coma alimentos con [address] contenido de [place], como cereales [place], frijoles, [place], [place], [place], guisantes y [place]. Consejos generales   Si necesita adelgazar, consulte a su mdico.  Evite lo siguiente: ? Alimentos con Engineer, mining. ? Comidas fritas. ? Alimentos con aceites parcialmente hidrogenados.  Limite el consumo de alcohol a no ms de por da si es mujer y no est [place], y por da si es hombre. Una medida equivale a 12oz de Financial controller,  5oz de vino o 1oz de bebidas alcohlicas de alta graduacin. Lea las etiquetas de los alimentos  Lea las etiquetas de los alimentos para conocer lo siguiente: ? Si contienen grasas trans. ? Si contienen aceites parcialmente hidrogenados. ? La cantidad de grasas saturadas (g) que contiene cada porcin. ? La cantidad de colesterol (mg) que contiene cada porcin. ? La cantidad de fibra (g) que contiene cada porcin.  Elija alimentos con grasas saludables, tales como las  siguientes: ? Grasas monoinsaturadas. ? Grasas poliinsaturadas. ? Grasas omega3.  Elija productos de cereal que tengan cereales integrales. Busque la palabra "integral" en Equities trader de la lista de ingredientes. Al cocinar  Emplee mtodos de coccin con poca cantidad de grasa. Por ejemplo, hornear, hervir, grillar y Holiday representative.  Coma ms comidas caseras. Coma en restaurantes y bares con menos frecuencia.  Evite cocinar usando grasas saturadas, como [place], crema, aceite de [place], aceite de palmiste y aceite de [place]. Petal recomendados  Frutas  Frutas frescas, en conserva (en su jugo natural) o frutas congeladas. Verduras  Verduras frescas o congeladas (crudas, al vapor, asadas o grilladas). Ensaladas de hojas verdes. Granos  Cereales integrales, como panes, galletas, cereales y pastas de integrales o de trigo integral. Avena sin endulzar, trigo bulgur, cebada, quinua o arroz integral. Tortillas de harina de maz o trigo integral. Carnes y otros alimentos ricos en protenas  Carne de res molida (al 85% o ms magra), carne de res de animales alimentados con pastos o carne de res sin la grasa. Pollo o pavo sin piel. Carne de pollo o de [place]. Cerdo sin la grasa. Todos los pescados y frutos de mar. Claras de huevo. Porotos, guisantes o lentejas secos. Frutos secos o semillas sin sal. Frijoles enlatados sin sal. Mantequillas de frutos secos sin azcar ni aceite agregados. Lcteos  Productos lcteos descremados o semidescremados, como [employer] Corporation o al 1%, quesos reducidos en grasas o al 2%, queso cottage o ricota con bajo contenido de [place] o sin contenido de [place], o yogur natural descremado o semidescremado. Grasas y aceites  Margarina untable que no contenga grasas trans. [place] y condimentos para ensaladas livianos o reducidos en grasas. Aguacate. Aceites de oliva, canola, ssamo o crtamo. Es posible que los productos que se enumeran ms [place] no constituyan  una lista completa de los alimentos y las bebidas que puede tomar. Consulte a un nutricionista para obtener ms informacin. Alimentos que deben evitarse Frutas  Fruta enlatada en almbar espeso. Frutas con salsa de crema o mantequilla. Frutas cocidas en aceite. Verduras  Verduras cocinadas con salsas de queso, crema o mantequilla. Verduras fritas. Granos  Pan blanco. Pastas blancas. Arroz blanco. Pan de maz. Bagels, pasteles y croissants. Galletas saladas y colaciones que contengan grasas trans y aceites hidrogenados. Carnes y otros alimentos ricos en protenas  Cortes de carne con alto contenido de [place]. Costillas, alas de pollo, tocineta, salchicha, mortadela, salame, chinchulines, tocino, perros calientes, salchichas alemanas y embutidos envasados. Hgado y otros rganos. Huevos enteros y yemas de [place]. Pollo y pavo con piel. Carne frita. Lcteos  Leche entera o al 2%, crema, mezcla de [place] y crema, y queso crema. Quesos enteros. Yogur entero o endulzado. Quesos con toda su grasa. [place]rem[place] no lcteas y coberturas batidas. Quesos procesados, quesos para untar y [place]. Bebidas  Alcohol. Bebidas endulzadas con azcar, como refrescos, limonada y bebidas frutales. Grasas y aceites  [place], [place] en barra, [place] de [place], [place], [place] clarificada o grasa de tocino. Aceites de coco, de palmiste  y de palma. Dulces y postres  Jarabe de maz, azcares, miel y Radio broadcast assistant. Caramelos. Mermeladas y [place]. Doreen Beam. Cereales endulzados. Galletas, pasteles, bizcochuelos, donas, muffins y helado. Es posible que los productos que se enumeran ms [place] no constituyan una lista completa de los alimentos y las bebidas que Personnel officer. Consulte a un nutricionista para obtener ms informacin. Resumen  Elegir los alimentos adecuados ayuda a Pharmacologist normales los niveles de grasas y [place]. Esto puede evitarle contraer ciertas enfermedades.  En las comidas, llene la mitad del plato  con verduras y ensaladas de hojas verdes.  Coma alimentos con alto contenido de [place], como cereales [place], frijoles, [place], zanahorias, guisantes y [place].  Limite los alimentos con azcar agregada y grasas saturadas, el alcohol y las comidas fritas. Esta informacin no tiene Theme park manager el consejo del mdico. Asegrese de hacerle al mdico cualquier pregunta que tenga. Document Revised: 01/09/2018 Document Reviewed: 04/25/2017 Elsevier Patient Education  2020 Elsevier Inc. Plan de alimentacin DASH DASH Eating Plan DASH es la sigla en ingls de "Enfoques Alimentarios para Detener la Hipertensin" (Dietary Approaches to Stop Hypertension). El plan de alimentacin DASH ha demostrado bajar la presin arterial elevada (hipertensin). Tambin puede reducir Lex[employer] International de diabetes tipo 2, enfermedad cardaca y accidente cerebrovascular. Este plan tambin puede ayudar a Geophysical data processor. Consejos para seguir este plan  Pautas generales  Evite ingerir ms de 2,300 mg (miligramos) de sal (sodio) por da. Si tiene hipertensin, es posible que necesite reducir la ingesta de sodio a 1,500 mg por da.  Limite el consumo de alcohol a no ms de por da si es mujer y no est [place], y por da si es hombre. Una medida equivale a 12oz ( ) de cerveza, 5oz ( ) de vino o 1oz (16ml) de bebidas alcohlicas de alta graduacin.  Trabaje con su mdico para mantener un peso saludable o perder [employer]. Pregntele cul es el peso recomendado para usted.  Realice al menos 30 minutos de ejercicio que haga que se acelere su corazn (ejercicio Magazine features editor) la [employer]V de la [place]. Estas actividades pueden incluir caminar, nadar o andar en bicicleta.  Trabaje con su mdico o especialista en alimentacin y nutricin (nutricionista) para ajustar su plan alimentario a sus necesidades calricas personales. Lectura de las etiquetas de los alimentos   Verifique en las  etiquetas de los alimentos, la cantidad de sodio por porcin. Elija alimentos con menos del 5 por ciento del valor diario de sodio. Generalmente, los alimentos con menos de 300 mg de sodio por porcin se encuadran dentro de este plan alimentario.  Para encontrar cereales integrales, busque la palabra "integral" como primera palabra en la lista de ingredientes. De compras  Compre productos en los que en su etiqueta diga: "bajo contenido de sodio" o "sin agregado de sal".  Compre alimentos frescos. Evite los alimentos enlatados y comidas precocidas o congeladas. Coccin  Evite agregar sal cuando cocine. Use hierbas o aderezos sin sal, en lugar de sal de mesa o sal marina. Consulte al mdico o farmacutico antes de usar sustitutos de la sal.  No fra los alimentos. A la hora de cocinar los alimentos opte por hornearlos, hervirlos, grillarlos y asarlos a Patent attorney.  Cocine con aceites cardiosaludables, como oliva, canola, soja o girasol. Planificacin de las comidas  Consuma una dieta equilibrada, que incluya lo siguiente: ? 5o ms porciones de frutas y Warehouse manager. Trate de que la mitad del plato de cada comida sean frutas y verduras. ? Hasta  6 u 8 porciones de cereales integrales por da. ? Menos de 6 onzas de carne, aves o pescado Copy. Una porcin de 3 onzas de carne tiene casi el mismo tamao que un mazo de cartas. Un huevo equivale a 1 onza. ? Dos porciones de productos lcteos descremados por Futures trader. ? Una porcin de frutos secos, semillas o frijoles 5 veces por semana. ? Grasas cardiosaludables. Las grasas saludables llamadas cidos grasos omega-3 se encuentran en alimentos como semillas de lino y pescados de agua fra, como por ejemplo, sardinas, salmn y caballa.  Limite la cantidad que ingiere de los siguientes alimentos: ? Alimentos enlatados o envasados. ? Alimentos con alto contenido de grasa trans, como alimentos fritos. ? Alimentos con alto contenido de grasa  saturada, como carne con grasa. ? Dulces, postres, bebidas azucaradas y otros alimentos con azcar agregada. ? Productos lcteos enteros.  No le agregue sal a los alimentos antes de probarlos.  Trate de comer al menos 2 comidas vegetarianas por semana.  Consuma ms comida casera y menos de restaurante, de bufs y comida rpida.  Cuando coma en un restaurante, pida que preparen su comida con menos sal o, en lo posible, sin nada de sal. Qu alimentos se recomiendan? Los alimentos enumerados a continuacin no constituyen Water quality scientist. Hable con el nutricionista sobre las mejores opciones alimenticias para usted. Cereales Pan de salvado o integral. Pasta de salvado o integral. Arroz integral. Avena. Quinua. Trigo burgol. Cereales integrales y con bajo contenido de [place]. Pan pita. Galletitas de [place] con bajo contenido de [place] of 60 deg [place] y [place]. Tortillas de [place] integral. Verduras Verduras frescas o congeladas (crudas, al vapor, asadas o grilladas). Jugos de tomate y verduras con bajo contenido de sodio o reducidos en sodio. Salsa y pasta de tomate con bajo contenido de sodio o reducidas en sodio. Verduras enlatadas con bajo contenido de sodio o reducidas en sodio. Frutas Todas las frutas frescas, congeladas o disecadas. Frutas enlatadas en jugo natural (sin agregado de azcar). Carne y otros alimentos proteicos Pollo o pavo sin piel. Carne de pollo o de [place]. Cerdo desgrasado. Pescado y [employer]. Claras de huevo. Porotos, guisantes o lentejas secos. Frutos secos, mantequilla de frutos secos y semillas sin sal. Frijoles enlatados sin sal. Cortes de carne vacuna magra, desgrasada. Embutidos magros, con bajo contenido de [place]. Lcteos Leche descremada (1%) o descremada. Quesos sin grasa, con bajo contenido de grasa o descremados. Queso blanco o ricota sin grasa, con bajo contenido de [place]. Yogur semidescremado o descremado. Queso con bajo contenido de [place]the [place] of [place] deg [place]) y [place]. Grasas y  [employer] untables que no contengan grasas trans. Aceite vegetal. Jerolyn Shin y aderezos para ensaladas livianos o con bajo contenido de grasas (reducidos en sodio). Aceite de canola, crtamo, oliva, soja y [place]. Aguacate. Condimentos y otros alimentos Hierbas. Especias. Mezclas de condimentos sin sal. Palomitas de maz y pretzels sin sal. Dulces con bajo contenido de grasas. Qu alimentos no se recomiendan? Los alimentos enumerados a continuacin no constituyen Water quality scientist. Hable con el nutricionista sobre las mejores opciones alimenticias para usted. Cereales Productos de panificacin hechos con grasa, como medialunas, magdalenas y algunos panes. Comidas con arroz o pasta seca listas para usar. Verduras Verduras con crema o fritas. Verduras en salsa de [place]. Verduras enlatadas regulares (que no sean con bajo contenido de sodio o reducidas en sodio). Pasta y salsa de tomates enlatadas regulares (que no sean con bajo contenido de sodio o reducidas en sodio). Jugos de tomate y verduras regulares (que no  sean con bajo contenido de sodio o reducidos en sodio). Pepinillos. Aceitunas. Nils PyleFrutas Fruta enlatada en almbar liviano o espeso. Frutas cocidas en aceite. Frutas con salsa de crema o [place]. Carne y otros alimentos proteicos Cortes de carne con grasa. Costillas. Carne frita. [place]. Salchichas. Mortadela y otras carnes procesadas. Salame. Panceta. Perros calientes (hotdogs). Salchicha de cerdo. Frutos secos y semillas con sal. Frijoles enlatados con agregado de sal. Pescado enlatado o ahumado. Huevos enteros o yemas. Pollo o pavo con piel. Lcteos Leche entera o al 2%, crema y [address] leche y mitad crema. Queso crema entero o con toda su grasa. Yogur entero o endulzado. Quesos con toda su grasa. Sustitutos de cremas no lcteas. Coberturas batidas. Quesos para untar y quesos procesados. Grasas y Barnes & Nobleaceites [place]. Margarina en barra. Manteca de cerdo. Materia grasa. [place]  clarificada. Grasa de panceta. Aceites tropicales como aceite de coco, palmiste o palma. Condimentos y otros alimentos Palomitas de maz y pretzels con sal. Sal de cebolla, sal de ajo, sal condimentada, sal de mesa y sal marina. Salsa [place]. Salsa trtara. Salsa barbacoa. Salsa teriyaki. Salsa de soja, incluso la que tiene contenido reducido de [place]. Salsa de carne. Salsas en lata y envasadas. Salsa de pescado. Salsa de [place] Forestostras. Salsa rosada. Rbano picante envasado. [place]. Mostaza. Saborizantes y tiernizantes para carne. Caldo en cubitos. Salsa picante y salsa tabasco. Escabeches envasados o ya preparados. Aderezos para tacos prefabricados o envasados. Salsas. Aderezos comunes para ensalada. Dnde encontrar ms informacin:  The Krogernstituto Nacional del [address], los Pulmones y Risk managerla Sang[place] (National Heart, Lung, and Blood Institute): PopSteam.iswww.nhlbi.nih.gov  Asociacin Estadounidense del Corazn (American Heart Association): www.heart.org Resumen  El plan de alimentacin DASH ha demostrado bajar la presin arterial elevada (hipertensin). Tambin puede reducir Lexmark Internationalel riesgo de diabetes tipo 2, enfermedad cardaca y accidente cerebrovascular.  Con el plan de alimentacin DASH, deber limitar el consumo de sal (sodio) a 2,300 mg por da. Si tiene hipertensin, es posible que necesite reducir la ingesta de sodio a 1,500 mg por da.  Cuando siga el plan de alimentacin DASH, trate de comer ms frutas frescas y verduras, cereales integrales, carnes magras, lcteos descremados y grasas cardiosaludables.  Trabaje con su mdico o especialista en alimentacin y nutricin (nutricionista) para ajustar su plan alimentario a sus necesidades calricas personales. Esta informacin no tiene Theme park managercomo fin reemplazar el consejo del mdico. Asegrese de hacerle al mdico cualquier pregunta que tenga. Document Revised: 08/28/2016 Document Reviewed: 08/28/2016 Elsevier Patient Education  2020 Arvin[employer]itor[employer]vier Inc.

## 2019-06-19 LAB — MICROSCOPIC EXAMINATION
Casts: NONE SEEN /lpf
Epithelial Cells (non renal): NONE SEEN /hpf (ref 0–10)

## 2019-06-19 LAB — UA/M W/RFLX CULTURE, ROUTINE
Bilirubin, UA: NEGATIVE
Glucose, UA: NEGATIVE
Ketones, UA: NEGATIVE
Leukocytes,UA: NEGATIVE
Nitrite, UA: NEGATIVE
Protein,UA: NEGATIVE
RBC, UA: NEGATIVE
Specific Gravity, UA: 1.015 (ref 1.005–1.030)
Urobilinogen, Ur: 0.2 mg/dL (ref 0.2–1.0)
pH, UA: 7 (ref 5.0–7.5)

## 2019-06-20 ENCOUNTER — Telehealth: Payer: Self-pay

## 2019-06-23 NOTE — Progress Notes (Signed)
Patient pre-screened for BCCCP eligibility due to COVID 19 precautions. Two patient identifiers used for verification that I was speaking to correct patient.  Patient to Present directly to [facility] 06/24/19 for BCCCP screening mammogram. 

## 2019-06-24 ENCOUNTER — Ambulatory Visit: Payer: Self-pay | Attending: Oncology | Admitting: *Deleted

## 2019-06-24 ENCOUNTER — Ambulatory Visit
Admission: RE | Admit: 2019-06-24 | Discharge: 2019-06-24 | Disposition: A | Discharge status: Home / Self Care | Payer: Medicaid Other | Source: Ambulatory Visit | Attending: Oncology | Admitting: Oncology

## 2019-06-24 ENCOUNTER — Encounter: Payer: Self-pay | Admitting: *Deleted

## 2019-06-24 DIAGNOSIS — Z0000 Encounter for general adult medical examination without abnormal findings: Secondary | ICD-10-CM | POA: Insufficient documentation

## 2019-06-24 NOTE — Progress Notes (Signed)
  Subjective:     Patient ID: Victoria Lee, female   DOB: 12/15/1960, 59 y.o.   MRN: [id number]  HPI   Review of Systems     Objective:   Physical Exam     Assessment:     Due to Covid 19 pandemic a televisit was used to enroll patient into our BCCCP program and obtain her health history.  She denies any breast problems.  Last pap was in 2012 in [place].  Patient will be rescheduled for her pap smear when the pandemic numbers are more stable.  Patient has been screened for eligibility.  She does not have any insurance, Medicare or Medicaid.  She also meets financial eligibility.  See the [facility] breast cancer risk assessment below. Risk Assessment    Risk Scores      06/24/2019   Last edited by: Scarlett Presto, RN   5-year risk: 0.7 %   Lifetime risk: 4.4 %            Plan:     Screening mammogram ordered.  Will follow up per BCCCP protocol.

## 2019-06-25 ENCOUNTER — Encounter: Payer: Self-pay | Admitting: *Deleted

## 2019-06-25 ENCOUNTER — Ambulatory Visit: Payer: Medicaid Other | Admitting: Pharmacy Technician

## 2019-06-25 DIAGNOSIS — Z79899 Other long term (current) drug therapy: Secondary | ICD-10-CM

## 2019-06-25 NOTE — Progress Notes (Signed)
Letter mailed from the [facility] to inform patient of her normal mammogram results.  Patient is to follow-up with annual screening in one year. 

## 2019-06-26 ENCOUNTER — Other Ambulatory Visit: Payer: Self-pay

## 2019-06-26 NOTE — Progress Notes (Addendum)
Patient speaks Spanish.  Interpretation provided by [employer], Interpreter ID [phone]. Completed [facility] application.  Patient took contract for daughter to review with her.  Patient to sign contract and Patient Advocate Letter and return to [facility].    Patient still needs to provide 2020 tax return.  Patient temporarily approved to receive medication assistance at [facility].  Patient understands that if 2020 tax return is not provided to [facility] by 09/20/19, then patient will be made inactive and no additional medication assistance will be provided.  Provided patient with community resource material based on her particular needs.    Sherilyn Dacosta Care Manager [facility]

## 2019-06-30 ENCOUNTER — Other Ambulatory Visit: Payer: Medicaid Other

## 2019-07-03 ENCOUNTER — Telehealth: Payer: Self-pay | Admitting: Pharmacist

## 2019-07-03 NOTE — Telephone Encounter (Signed)
07/03/2019 10:34:58 AM - Myrbetriq  -- Rhetta Mura - Thursday, July 03, 2019 10:33 AM -- I have faxed [place] application for Myrbetriq 25mg  Take one tablet by mouth every day. 

## 2019-07-11 ENCOUNTER — Telehealth: Payer: Self-pay | Admitting: Pharmacy Technician

## 2019-07-11 NOTE — Telephone Encounter (Signed)
Attempted to contact patient regarding deposits on December 2020 bank statement and to request January 2021 bank statement.  Unable to reach.  Message left by [place], Tobie Lords [phone] from [facility].  Mailing request for January 2021 bank statement.    Sherilyn Dacosta [facility]

## 2019-11-20 ENCOUNTER — Telehealth: Payer: Self-pay | Admitting: Pharmacy Technician

## 2019-11-20 NOTE — Telephone Encounter (Signed)
Patient failed to provide 2020 taxes.  No additional medication assistance will be provided by [facility] until patient provides requested financial documentation.  Sherilyn Dacosta [facility]

## 2020-02-12 ENCOUNTER — Ambulatory Visit: Admit: 2020-02-12 | Discharge: 2020-02-12 | Payer: MEDICAID | Attending: Ophthalmology | Primary: Family Medicine

## 2020-02-12 DIAGNOSIS — H353112 Nonexudative age-related macular degeneration, right eye, intermediate dry stage: Secondary | ICD-10-CM

## 2020-02-12 MED ORDER — FLUORESCEIN-BENOXINATE 0.25-0.4 % OP SOLN
Freq: Once | OPHTHALMIC | Status: AC
Start: 2020-02-12 — End: 2020-02-12
  Administered 2020-02-12: 15:00:00 1 [drp] via OPHTHALMIC

## 2020-02-12 MED ORDER — TROPICAMIDE 1 % OP SOLN
1 % | Freq: Once | OPHTHALMIC | Status: AC
Start: 2020-02-12 — End: 2020-02-12
  Administered 2020-02-12: 15:00:00 1 [drp] via OPHTHALMIC

## 2020-02-12 MED ORDER — PHENYLEPHRINE HCL 2.5 % OP SOLN
2.5 % | Freq: Once | OPHTHALMIC | Status: AC
Start: 2020-02-12 — End: 2020-02-12
  Administered 2020-02-12: 15:00:00 1 [drp] via OPHTHALMIC

## 2020-02-12 NOTE — Progress Notes (Signed)
OPHTHALMOLOGY VISIT    Date of Service:  02/12/2020  Attending Provider:  No att. 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 Pt states ou have been watery and burning a lot. Pt notices blurry va in ou when this is happening. Pt states she also is having have a sharp in os that started about a yr in half ago. Pt states she was working in the heat and it seemed to effect ou. Pt has not worked there for 6 months now. Pt states [place], but there is no evidence of this on exam as she has her natural lenses with cataracts in both eyes. Pt states that she has always had bad va in os.   Pt denies uses gtts at this time.   FOH: none known  No results found for: NA, K, CL, CO2, BUN, CREATININE, GLUCOSE, CALCIUM   Last BP recorded: 122/78  as of 04/15/2018          Review of Systems:     ROS     Positive for: Eyes    Negative for: Constitutional, Gastrointestinal, Neurological, Skin, Genitourinary, Musculoskeletal, HENT, Endocrine, Cardiovascular, Respiratory, Psychiatric, Allergic/Imm, Heme/Lymph          Medical/Surgical History:     Past Medical History:   Diagnosis Date   ??? Cataract      Past Surgical History:   Procedure Laterality Date   ??? BACK SURGERY  2009   ??? CATARACT REMOVAL Right     2008   ??? CHOLECYSTECTOMY     ??? HERNIA REPAIR         Family/Social History:     Family History   Problem Relation Age of Onset   ??? Other Mother      Social History     Socioeconomic History   ??? Marital status: Single     Spouse name: None   ??? Number of children: None   ??? Years of education: None   ??? Highest education level: 9th grade   Occupational History   ??? None   Tobacco Use   ??? Smoking status: Never Smoker   ??? Smokeless tobacco: Never Used   Vaping Use   ??? Vaping Use: Never used   Substance and Sexual Activity   ??? Alcohol use: Never   ??? Drug use:  Never   ??? Sexual activity: Not Currently   Other Topics Concern   ??? None   Social History Narrative   ??? None     Social Determinants of Health     Financial Resource Strain:    ??? Difficulty of Paying Living Expenses:    Food Insecurity:    ??? Worried About Programme researcher, broadcasting/film/video in the Last Year:    ??? Barista in the Last Year:    Transportation Needs:    ??? Freight forwarder (Medical):    ??? Lack of Transportation (Non-Medical):    Physical Activity:    ??? Days of Exercise per Week:    ??? Minutes of Exercise per Session:    Stress:    ??? Feeling of Stress :    Social Connections:    ??? Frequency of Communication with  Friends and Family:    ??? Frequency of Social Gatherings with Friends and Family:    ??? Attends Religious Services:    ??? Database administrator or Organizations:    ??? Attends Engineer, structural:    ??? Marital Status:    Intimate Programme researcher, broadcasting/film/video Violence:    ??? Fear of Current or Ex-Partner:    ??? Emotionally Abused:    ??? Physically Abused:    ??? Sexually Abused:        Drug Allergies:   No Known Allergies      Medications:   Not in a hospital admission.     Vital Signs:    There were no vitals filed for this visit.    Physical Exam:     Base Eye Exam     Visual Acuity (Snellen - Linear)       Right Left    Dist cc 20/25 CF at 3 inches    Near cc J2 No J    Correction: Glasses          Tonometry (Applanation w Fluress drop, 10:11 AM)       Right Left    Pressure 11 12          Pupils       Pupils Dark Light Shape React APD    Right PERRL 4 3 Round Brisk None    Left PERRL 4 3 Round Brisk None          Visual Fields       Right Left     Full Full          Extraocular Movement       Right Left     Full, Ortho Full, Ortho          Neuro/Psych     Oriented x3: Yes    Mood/Affect: Normal          Dilation     Both eyes: 2.5% Phenylephrine HCL, 1% Tropicamide @ 10:11 AM   Angles appear open, dilation discussed.               Slit Lamp and Fundus Exam     External Exam       Right Left    External Normal Normal           Slit Lamp Exam       Right Left    Lids/Lashes Normal Normal    Conjunctiva/Sclera White and quiet White and quiet    Cornea Clear Clear    Anterior Chamber Deep and quiet Deep and quiet    Iris Round and reactive Round and reactive    Lens 2+ Nuclear sclerosis, Trace Cortical cataract 2+ Nuclear sclerosis    Vitreous Posterior vitreous detachment Posterior vitreous detachment          Fundus Exam       Right Left    Disc Normal Normal    C/D Ratio 0.25 0.25    Macula Moderate RPE pigmentation with atrophy, no heme Severe atrophy with moderate to hemorrhage in all layers with RPE pigmentation.      Vessels Normal Normal    Periphery Normal Normal            Refraction     Wearing Rx       Sphere Cylinder Axis Add    Right +1.75 +0.75 014 +2.00    Left +0.75 +1.25 006 +2.00    Age: 47yrs  Type: PAL          Manifest Refraction       Sphere Cylinder Axis Dist VA Add [place]    Right +1.50 +1.00 015 20/25 +2.00 J1    Left Balance    +2.00 J1                Assessment:   Kristina [name] is a 59 y.o. female      ICD-10-CM    1. Nonexudative age-related macular degeneration, right eye, intermediate dry stage  H35.3112 Posterior Segment Retina OCT - OU - Both Eyes   2. Exudative age-related macular degeneration of left eye with active choroidal neovascularization (HCC)  H35.3221 Posterior Segment Retina OCT - OU - Both Eyes   3. Combined forms of age-related cataract of both eyes  H25.813    4. Dermatochalasis of both upper eyelids  H02.831     H02.834    5. PVD (posterior vitreous detachment), bilateral  H43.813        Plan:        ICD-10-CM    1. 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      K99.8338 Posterior Segment Retina OCT - OU - Both Eyes   2. Exudative age-related macular degeneration of left eye with active  choroidal neovascularization (HCC): sent to retina for evaluation and possible treatment:   Advised regular use of Amsler grid, AREDS supplements recommended, BP control, UV protection and dark leafy green vegetables.      S50.5397 Posterior Segment Retina OCT - OU - Both Eyes   3. Combined forms of age-related cataract of both eyes: stable for now, monitor  H25.813    4. Dermatochalasis of both upper eyelids:  monitor  H02.831     H02.834    5. PVD (posterior vitreous detachment), bilateral: monitor yearly  H43.813      Follow up: will wait for retina evaluation to determine follow up

## 2020-05-31 ENCOUNTER — Telehealth: Payer: Self-pay | Admitting: Pharmacy Technician

## 2020-05-31 NOTE — Telephone Encounter (Signed)
Patient failed to provide 2021 proof of income.  No additional medication assistance will be provided by [facility] without the required proof of income documentation.  Patient notified by letter.  Sherilyn Dacosta [facility]

## 2020-12-31 ENCOUNTER — Inpatient Hospital Stay: Admit: 2020-12-31 | Attending: Nurse Practitioner

## 2020-12-31 NOTE — Other (Unsigned)
Patient Acct Nbr: [id number]   Primary AUTH/CERT:   Primary Insurance Company Name: [employer]  Primary Insurance Plan name: [employer]  Primary Insurance Group Number:   Primary Insurance Plan Type: Health  Primary Insurance Policy Number: [id number]

## 2021-01-12 ENCOUNTER — Inpatient Hospital Stay: Admit: 2021-01-12 | Attending: Nurse Practitioner

## 2021-01-12 NOTE — Other (Unsigned)
Patient Acct Nbr: [id number]   Primary AUTH/CERT:   Primary Insurance Company Name: [employer]  Primary Insurance Plan name: [employer]  Primary Insurance Group Number:   Primary Insurance Plan Type: Health  Primary Insurance Policy Number: [id number]

## 2021-01-12 NOTE — Progress Notes (Signed)
Notified S. Sims, FNP at 4 pm spoke to [place].  I discussed the recommendation from our Radiologist Dr. Purcell Nails after imaging in the breast center today.  The Radiologist has recommended a referral for surgical consult/biopsy of the RIGHT breast, [place].   There was a discussion with the patient and daughter using interpreter Misael ([id number])regarding the imaging findings, follow up, surgical consult and the type of biopsy recommended. Biopsy teaching sheets were provided. Family history was reviewed. The patient has been scheduled to follow up with a breast specialist Vita Erm, CNP, on January 19, 2021 at 11 am, arrive at 10:45 am in the [facility], [address]. A biopsy was coordinated for same day with arrival at 2 pm, Chip Boer aware.     A Diagnostic Plan of Care was provided to the patient and faxed to the referring provider. All questions were answered. The contact for the nurse navigator was provided and the patient was encouraged to call with any additional questions or concerns.     Antonieta Iba, BSN, Administrator, [facility]  Dr. Jillyn Hidden B. And Evlyn Kanner. [facility]  [address], [place] [address]  [phone]  [email or web address]

## 2021-01-19 ENCOUNTER — Inpatient Hospital Stay: Attending: Adult Health

## 2021-01-19 ENCOUNTER — Ambulatory Visit: Admit: 2021-01-19 | Discharge: 2021-01-19 | Payer: MEDICAID | Attending: Adult Health

## 2021-01-19 DIAGNOSIS — N6310 Unspecified lump in the right breast, unspecified quadrant: Secondary | ICD-10-CM

## 2021-01-19 MED ORDER — LIDOCAINE HCL (PF) 1 % IJ SOLN
1 % | Freq: Once | INTRAMUSCULAR | Status: DC
Start: 2021-01-19 — End: 2021-01-20

## 2021-01-19 NOTE — Progress Notes (Signed)
Chief complaint: Right breast mass here for an H&P prior to biopsy    HPI:  Kristina [name] is a 60 y.o. female who presents for an H&P prior to biopsy on the right breast 7:00 1 cm from the nipple.    Area was found on baseline mammogram but patient states she does feel it and it is tender to touch.    Patient denies allergies to lidocaine latex and adhesive.  Patient is on Pletal due to peripheral claudication and PVD        Breast Imaging:  Bilateral breast ultrasound 01/12/2021 -  FINDINGS:   The patient was recalled from screening mammogram on 12/31/2020 for bilateral breast masses.       Ultrasound scanning of the bilateral breasts and right axilla was performed by a registered   sonographer    with Doppler.       Right breast:   At the 3:00 position in the subareolar region an oval parallel circumscribed hypoechoic mass is   seen    measuring 0.4 x 0.3 x 0.4 cm. The mass is avascular.       At the 7:00 position 1 cm from the nipple an irregular hypoechoic mass is seen measuring 0.5 x   0.4 x 0.6   cm. The mass is avascular. Findings correspond to the mammographic mass.       At the 6:00 position in the subareolar region a benign cyst is seen measuring 0.6 x 0.2 x 0.7    cm.       Scanning of the right axilla demonstrates two morphologically normal-appearing lymph nodes.       Left breast:   At the 2:00 position in the subareolar region a benign cyst is seen measuring 1.1 x 0.7 x 1.1    cm.   The        cyst is avascular. Findings correspond to the mammographic mass.       IMPRESSION: Suspicious right breast mass at the 7:00 position 1 cm from the nipple    corresponding   to the    previously described mammographic mass. Surgical consultation and ultrasound guided core needle   biopsy    is recommended. Findings were discussed with the patient by Dr. Hayes Ludwig at the time of this   dictation.       Probably benign probable complicated right breast cyst at the 3:00 position in the subareolar   region.    Short-term  follow-up ultrasound in six months is recommended to document stability.       Benign left breast cyst corresponding to the previously described mammographic mass. No further       follow-up is required.                                                Ultrasound           Report               ASSESSMENT: Category 4 Suspicious - Right   RECOMMENDATION: Ultrasound guided core biopsy of the right breast.   Ultrasound of the right breast in 6 months.     Screening mammogram 12/31/2020 -Baseline mammogram  TISSUE DENSITY: BIRADS B - There are scattered fibroglandular densities.  Marland Kitchen       FINDINGS: A 1 cm mass is seen within the upper outer  quadrant of the left breast, middle depth.   In    addition, a 0.8 cm oval circumscribed mass is seen within the right breast at approximately the   6:00    location posterior depth. A bilateral breast ultrasound is recommended. No additional    suspicious   masses,       calcifications or architectural distortion identified.   This was a baseline study. Therefore, there were no studies used for comparison.           ASSESSMENT: Category 0 Incomplete: need additional imaging evaluation   RECOMMENDATION: Ultrasound of both breasts.     Risk Factors  Menarche: 41  Age of first birth: 82 G5 P2  Menopause: Postmenopausal age 68  Smoking: Never  ETOH: None      Family history includes:  No family history of cancer     The lifetime risk of breast cancer estimated by the Hawthorne model is 3.93%.   The estimated risk of a BRCA 1/2 mutation predicted by [facility] is 0.1%.   The estimated risk of a mutation in an HNPCC related gene is 0.13%.  The patient does not meet NCCN criteria for genetic testing for hereditary cancer.     Past Medical History:   Diagnosis Date    Cataract     Hx of cholecystectomy 2008    Previous back surgery     lower back disc and hernia     Past Surgical History:   Procedure Laterality Date    BACK SURGERY  2009    CATARACT REMOVAL Right     2008    CHOLECYSTECTOMY       HERNIA REPAIR       No Known Allergies  Current Outpatient Medications on File Prior to Visit   Medication Sig Dispense Refill    cyclobenzaprine (FLEXERIL) 10 MG tablet TAKE 1 TABLET BY MOUTH ONCE DAILY AT BEDTIME AS NEEDED      Multiple Vitamins-Minerals (MULTIVITAMIN ADULT EXTRA C PO) Take by mouth      cilostazol (PLETAL) 100 MG tablet Take by mouth      pantoprazole (PROTONIX) 40 MG tablet Take by mouth      naproxen (NAPROSYN) 500 MG tablet Take 1 tablet by mouth 2 times daily (with meals) (Patient not taking: No sig reported) 60 tablet 1     No current facility-administered medications on file prior to visit.       Review of Systems:  Feels lump at 7:00 right breast 1 cm from nipple.  States it is tender to touch.  Review of Systems   Constitutional: Negative.    HENT: Negative.     Eyes: Negative.    Respiratory: Negative.     Cardiovascular: Negative.    Gastrointestinal: Negative.    Endocrine: Negative.    Musculoskeletal: Negative.    Skin: Negative.    Allergic/Immunologic: Negative.    Neurological: Negative.    Hematological: Negative.    Psychiatric/Behavioral: Negative.       Temp 98 ??F (36.7 ??C)    Ht '5\' 2"'  (1.575 m)    Wt 149 lb (67.6 kg)    BMI 27.25 kg/m??     Physical Exam:  Physical Exam  Vitals and nursing note reviewed.   Constitutional:       General: She is not in acute distress.     Appearance: Normal appearance. She is not ill-appearing.   HENT:      Head: Normocephalic and atraumatic.  Eyes:      Conjunctiva/sclera: Conjunctivae normal.   Neck:      Thyroid: No thyroid mass, thyromegaly or thyroid tenderness.   Cardiovascular:      Rate and Rhythm: Normal rate and regular rhythm.      Heart sounds: Normal heart sounds.   Pulmonary:      Effort: Pulmonary effort is normal. No respiratory distress.      Breath sounds: Normal breath sounds.   Chest:   Breasts:     Breasts are symmetrical.      Right: No inverted nipple, mass, nipple discharge, skin change, tenderness, axillary  adenopathy or supraclavicular adenopathy.      Left: No inverted nipple, mass, nipple discharge, skin change, tenderness, axillary adenopathy or supraclavicular adenopathy.          Comments: Otherwise normal breast exam  Abdominal:      Palpations: Abdomen is soft.   Musculoskeletal:      Cervical back: Normal range of motion and neck supple.   Lymphadenopathy:      Cervical: No cervical adenopathy.      Upper Body:      Right upper body: No supraclavicular, axillary or pectoral adenopathy.      Left upper body: No supraclavicular, axillary or pectoral adenopathy.   Skin:     General: Skin is warm and dry.   Neurological:      General: No focal deficit present.      Mental Status: She is alert and oriented to person, place, and time.   Psychiatric:         Mood and Affect: Mood normal.         Behavior: Behavior normal.        Assessment/Plan:   1. Mass of right breast  - US GUIDED RIGHT BREAST BIOPSY; Future       Biopsy procedure explained to patient.  Patient verbalizes understanding of procedure.  All questions and concerns were addressed during visit.  Biopsy results generally return in 2 to 8 days.  They will be reported to Mychart at the same time that I receive them at my computer.  Discussed with patient that if she sees atypical or carcinoma in the report then we will set her up with first available surgeon or surgeon of her choice.  If she sees other words then is generally benign and we will plan for follow-up upon results.  Discussed report needed to also give [place] concordance between the pathologist and the radiologist and that they may have 2 phone calls from [place] due to this coming after the initial report.  Patient advised to wear supportive bra for the next couple of days following the biopsy.  Also advised to use Tylenol and/or ibuprofen for discomfort and can use ice.  Avoid excessive movements causing breast movement to decrease risk for hematoma.  Discussed with patient she may have bruising at  the site or below the site depending on gravity.  Significant risks are infection or bleeding.  Okay to shower.  Avoid bathing and swimming for the next few days.  If results are benign we will plan for follow-up breast imaging in 6 months.  If she requires surgical intervention she will follow-up with first available surgeon.  Patient is to call with any questions or breast concerns.     Interpreter cart was used during visit.  Patient is here with daughter.  Asked to call daughter when results are in.  She will need an interpreter cart  as well if there is any in-depth instruction she states.  Daughter's name is Belki.  Per outside records patiently recently came to [place] from [place] and is living with her daughter.    I spent 40 minutes total on the day of the visit obtaining history, reviewing imaging and laboratory results, performing a physical exam and providing patient education and counseling and documenting.        Devyn Sheerin Veronda Prude, APRN - CNP  01/19/21  Please disregard any typographical errors. This note was dictated using voice recognition software.

## 2021-01-19 NOTE — Other (Unsigned)
Patient Acct Nbr: [id number]   Primary AUTH/CERT:   Primary Insurance Company Name: Medicaid  Primary Insurance Plan name: Medicaid  Primary Insurance Group Number:   Primary Insurance Plan Type: Health  Primary Insurance Policy Number: [id number]

## 2021-01-19 NOTE — Procedures (Addendum)
Vitals:    01/19/21 1520   BP: 119/66   Pulse: 82   TempSrc: Temporal   SpO2: 96%     Level of Consciousness & Pain Pre Procedure:     [x] Alert & Oriented [] Confused & Disoriented [] Other   Allergies to Lidocaine:   []  Yes  [x]  No  Blood Thinners:  [x]  Yes  []  No  if yes, please list: pt takes Pletal for PVD  Pain: [x]  None []  Present please describe:          Local Anesthesia:    [x] Lidocaine 1%     Total Dose Given = 10  mL    [] Nesacaine 2%        Total Dose Given   mL   Probe Used:                         []  12 Gauge Probe  []  14 Gauge Probe(Achieve)  [x]  14 Gauge Probe (Bard)   Specimen:  [x]  Clip Placed   [x]  Specimen "A"   [x]  Right []  Left    Position: 7:00   1 cm  FN  Samples = 3 passes     Lot Number =      Shape:  [x] Tumark X  [] Tumark Q  [] Hydromark Butterfly Coil        []  Specimen "B"  []  Right []  Left    Position:      cm  FN  Samples      Lot Number         Shape   [] Tumark X  [] Tumark Q  [] Hydromark Butterfly Coil    []  Specimen "C"  []  Right []  Left    Position:      cm  FN  Samples      Lot Number         Shape   [] Tumark X  [] Tumark Q  [] Hydromark Butterfly Coil    []  Specimen "D"  []  Right []  Left    Position:      cm FN  Samples      Lot Number         Shape   [] Tumark X  [] Tumark Q  [] Hydromark Butterfly Coil        Intraprocedure Notes:  Pain: 0/10  Bleeding []  None [x]  Scant []  Small []  Large []  EBL  Post Mammo [x]  Yes  []  No  Biopsy Site Care:    [x]  Pressure   [x]  Steri-Strip and Tegaderm  [x]  Cold Pack []  Ace  Level of Consciousness & Pain Post Procedure:   [x]   Pt tolerated procedure without any complications   []   Pt had vasovagal reaction    []   Hematoma present.  Pt made aware  [x]    Alert & Oriented []  Confused & Disoriened []  Other               Pain: 0/10  [x]  Post Procedure: Patient Discharge instructions given to patient, verbalizes             Understanding.  Discharge:   Time = 15:40  Accompanied by: daughter    To: home    Electronically signed by on  01/19/2021 at 3:51 PM

## 2021-01-19 NOTE — Procedures (Addendum)
Patient: Kristina [name] 1960/12/02        Patient had no complaints. Has taken Tylenol for discomfort. Pt was reminded of on call Breast specialist number if any complications occur.            Electronically signed by Verline Lema on 01/20/2021 at 1:09 PM

## 2021-01-21 ENCOUNTER — Encounter

## 2021-01-21 ENCOUNTER — Encounter: Payer: MEDICAID | Attending: Physician Assistant

## 2021-01-21 LAB — SURGICAL PATHOLOGY

## 2021-01-25 ENCOUNTER — Telehealth

## 2021-01-25 NOTE — Telephone Encounter (Signed)
Called with interpreter phone.  Message left for daughter [name]  To call me back at the desk number.  She can speak some English so if she calls and is unable to understand I will get the interpreter phone and call her back again.    Breast biopsy right breast with benign findings      FINAL SURGICAL PATHOLOGY REPORT   ________________________________________________________________________     NAMEDRINDA, [name]                   MRN            [id number]   D.O.B.:     April 18, 1961   59 Y   F         BILLING NO.:   [id number]   LOCATION:   [facility]                         PROCEDURE      01/19/2021   DATE:   SURGEON:    Kristina [name]              RECEIVED       01/20/2021   DATE:   ATTENDING:  Kayonna Lawniczak MARIE [name]              REPORT DATE:   01/21/2021       COPIES TO:        Blanch Media, MD   ________________________________________________________________________       DIAGNOSIS:     BREAST, RIGHT, 7:00 1 CM FROM NIPPLE, NEEDLE CORE BIOPSY - BENIGN   BREAST TISSUE WITH STROMAL FIBROSIS AND APOCRINE METAPLASIA

## 2021-01-26 ENCOUNTER — Encounter: Admit: 2021-01-26 | Discharge: 2021-01-26 | Payer: MEDICAID

## 2021-01-26 ENCOUNTER — Ambulatory Visit: Admit: 2021-01-26 | Discharge: 2021-01-26 | Payer: MEDICAID | Attending: Physician Assistant

## 2021-01-26 DIAGNOSIS — M5416 Radiculopathy, lumbar region: Secondary | ICD-10-CM

## 2021-01-26 MED ORDER — MELOXICAM 15 MG PO TABS
15 MG | ORAL_TABLET | Freq: Every day | ORAL | 0 refills | Status: AC
Start: 2021-01-26 — End: ?

## 2021-01-26 NOTE — Patient Instructions (Signed)
[facility]  [address] [place] [address]  Ph: [phone]

## 2021-01-26 NOTE — Progress Notes (Signed)
[facility]  [facility] [place]   [address]  [address]  [place] [address]  Dept: [phone]  Loc: [phone]        Kristina [name]  [birth year]  [id number]  01/26/2021      Problem List:    Lumbar pain  Lumbar radiculopathy  Lumbar stenosis with neurogenic claudication  Chronic L1 and L3 compression fractures  History of lumbar decompression (2008 in [place])  History of PVD      Chief Complaint   Patient presents with    Back Pain    Leg Pain     Interpreter number: [phone]    HPI:  Kristina [name] is a 60 y.o. female who is here today for evaluation of her lumbar spine.     Kristina [name] is referred by Susy [facility].     Is this a work related injury? 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     Symptoms are severely affecting their quality of life.    Associated neurologic complaints:   Gait/balance difficulty: admits to a lot - at times doesn't feel like body has strength to stand up   Use of ambulatory aid?: no assistive device  Bowel/bladder dysfunction: denies-unsure  Fine motor task difficulty: denies    Aggravating factors:   Tight clothes in waist     Alleviating Factors:   Rest    Do the patient's symptoms improve while lying down or sleeping?: Yes    Previous Treatment:   1) PT: no   2) NSAIDS: naproxen (Naprosyn)  3) Injections: none  4) Opiod medications: no  5) Muscle relaxers: None  6) Oral steroids: None  7) Nerve medications (gabapentin/Lyrica): No  8) Pain management: no     Smoking status: never smoked  History of DVT/PE or  hypercoagulable state (including history of relative): None diagnosed   Blood thinning medications: yes    Has the patient had spine surgery and/or consulted with another spine surgeon?: Yes, 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     No Known Allergies  Current Outpatient Medications   Medication Sig Dispense Refill    meloxicam (MOBIC) 15 MG tablet Take 1 tablet by mouth daily 30 tablet 0    sulfamethoxazole-trimethoprim (BACTRIM DS;SEPTRA DS) 800-160 MG per tablet TAKE 1 TABLET BY MOUTH TWICE DAILY FOR 7 DAYS      ibuprofen (ADVIL;MOTRIN) 800 MG tablet TAKE 1 TABLET BY MOUTH EVERY 8 HOURS AS NEEDED WITH FOOD OR MILK      cyclobenzaprine (FLEXERIL) 10 MG tablet TAKE 1 TABLET BY MOUTH ONCE DAILY AT BEDTIME AS NEEDED      Multiple Vitamins-Minerals (MULTIVITAMIN ADULT EXTRA C PO) Take by mouth      cilostazol (PLETAL) 100 MG tablet Take by mouth      pantoprazole (PROTONIX) 40 MG tablet Take by  mouth      naproxen (NAPROSYN) 500 MG tablet Take 1 tablet by mouth 2 times daily (with meals) (Patient not taking: No sig reported) 60 tablet 1     No current facility-administered medications for this visit.     Past Medical History:   Diagnosis Date    Cataract     Hx of cholecystectomy 2008    Previous back surgery     lower back disc and hernia     Past Surgical History:   Procedure Laterality Date    BACK SURGERY  2009    CATARACT REMOVAL Right     2008    CHOLECYSTECTOMY      HERNIA REPAIR       Social History     Socioeconomic History    Marital status: Single     Spouse name: Not on file    Number of children: Not on file    Years of education: Not on file    Highest education level: 9th grade   Occupational History    Not on file   Tobacco Use    Smoking status: Never    Smokeless tobacco: Never   Vaping Use    Vaping Use: Never used   Substance and Sexual Activity    Alcohol use: Never    Drug use: Never    Sexual  activity: Not Currently   Other Topics Concern    Not on file   Social History Narrative    Not on file     Social Determinants of Health     Financial Resource Strain: Not on file   Food Insecurity: Not on file   Transportation Needs: Not on file   Physical Activity: Not on file   Stress: Not on file   Social Connections: Not on file   Intimate Partner Violence: Not on file   Housing Stability: Not on file     Family History   Problem Relation Age of Onset    Other Mother      Physical Exam:    Temp 97.9 ??F (36.6 ??C)    Ht 5\' 2"  (1.575 m)    Wt 149 lb (67.6 kg)    BMI 27.25 kg/m??     SPINE/EXTREMITY:    General: Gait is mildly antalgic and unassisted.  Patient able to walk on heels and toes without difficulty.  There is mild diffuse paraspinal lumbar tenderness.  Midline lumbar 5 cm incision is well-healed.    Lower Extremity Motor:   HF Q TA EHL Peroneals GSC   Right 5 5 5 5 5 5    Left 5 5 5 5 5 5      Lower extremity sensation to light touch:   L2 L3 L4 L5 S1   Right Intact Intact Intact Intact Intact   Left Intact Intact Intact Intact Intact     Lower extremity reflexes:   Patellar Achilles   Right 2+ 2+   Left 2+ 2+     Straight leg raise:  Right Negative   Left Negative     Misc:   Clonus   Right None   Left None     Hip exam: Bilateral hip range of motion is full and symmetric without pain.  No SI tenderness    Radiographic findings:    Radiographs:     Lumbar Spine:   Date of Exam: 01/26/2021  Views: 4 views of the lumbar spine (AP, lateral)   Findings: There is no aortic calcification  noted. The bilateral hip joint spaces are observed with no significant degenerative changes noted. The bilateral SI joints are also observed with no significant degenerative changes noted. There is no loss of lumbar lordosis. There are five non rib-bearing lumbar vertebrae. Vertebral body heights maintained throughout the lumbar spine. There is no acute fracture or bony abnormality noted.  There is multilevel mild lumbar  degenerative disc disease.  There are mild spondylitic changes and facet arthropathy noted. There is no listhesis noted on flexion/extension radiographs.    Lumbar Spine:   Date of Exam: 12/31/2020  Views: 4 views of the lumbar spine (AP, lateral, flexion and extension)   Findings:   FINDINGS:       Normal SI joints. Mild L3 upper endplate compression fracture probably   chronic. Questionable mild compression deformity of L1 versus oblique   projection. No acute fracture seen. Mild disc space narrowing L4-L5   and L5-S1. Discs otherwise maintained. Mild facet arthrosis throughout   the lumbar spine.       IMPRESSION:        1. Multilevel degenerative changes. Chronic appearing mild upper   endplate L3 compression fracture, possible mild chronic compression   fracture of L1 as well versus oblique projection.     All documented radiology studies listed above were individually reviewed, interpreted (agree with radiology report unless noted below) and discussed with the patient during today's office visit.  In addition, there is mild multilevel lumbar degenerative disc disease with mild lumbar spondylosis.    Other Diagnostic Testing:  EMG:  DEXA:    Lab Review:  No labs were reviewed/no labs were available for review this visit.    IMPRESSION:    See problem list above.    Kristina [name] is a 60 y.o. female presenting with low back pain with radiation down the bilateral lower extremities to the calves.  She reports symptoms of neurogenic claudication.  She denies any bowel/bladder incontinence/retention or saddle paresthesias.  She has a history of a previous lumbar decompression for low back pain that alleviated her symptoms at this time.  It was after this procedure that her lower extremity symptoms began. I had a discussion with [facility] about her symptoms.  We independently reviewed her imaging which revealed diffuse degenerative changes.  her imaging and clinical exam reveals symptoms of lumbar radiculopathy.  We reviewed  the symptoms of lumbar radiculopathy.  We went over the natural history and the treatment of lumbar radiculopathy.  This problem is very common.  We also discussed that her PVD may be playing a role into her claudication.  She is currently taking Pletal for this.  We have no formal history in her chart of this diagnosis or recent ABI.  It generally responds to conservative management.  The conservative management includes medications, injections, exercise, chiropractic care, acupuncture, massage therapy, and physical therapy. We also discussed the surgical treatment of lumbar radiculopathy.  If the symptoms do not respond to conservative treatment the patient is a candidate for surgical intervention.  The decision to proceed with surgery is a quality of life issue.  The patient can continue conservative treatment as long as he or she wants. The patient seems to understand their diagnosis as well as the treatment options.  All questions were answered to the best of my ability.    At this point we have decided to proceed with a formal physical therapy program with a lumbar/core focus.  She will also start Mobic for her symptoms and discontinue  other anti-inflammatories.  She will follow-up if she has no improvement of her symptoms after 6 weeks of physical therapy.  At this time we will consider ordering a lumbar MRI with and without contrast to rule out nerve compression    I had a long discussion with Kristina [name] to make sure she had a good understanding of what I think the main issues and diagnoses are that are affecting her today, and reviewed the plan going forward.    PLAN:    Refer to PT  Lumbar/core focus  Postural Stability  Hip abductor strengthening  LE flexibility and mobility  Manual therapy, soft tissue mobilization/release, traction, dry needling, joint mobilizations, modalities, etc.   Mobic  Follow up 6 weeks after PT    The risks and appropriate dosing of NSAIDs (ie Meloxicam, Naproxen, Ibuprofen, etc) were  discussed with the patient in detail. These include but are not limited to cardiovascular risk, renal toxicity, stomach irritation/refulx and/or peptic ulcer disease. If any of these side effects are experienced I advised the patient to discontinue the medication and contact both myself and their primary care physician.     Return for call our office with any questions [phone], after physical therapy.    Electronically signed by Ola SpurrMark Britain Saber, PA-C    Dictated using Dragon Naturally Speaking Medical Version 2.4  Proof read however unrecognized voice recognition errors may have occurred

## 2021-01-31 NOTE — Telephone Encounter (Signed)
I called and notified daughter [name] about the benign biopsy results.  Imaging is concordant.  Patient Kristina [name] will need 83-month follow-up right breast ultrasound.  I did tell daughter I would send message to the front to call her back and get this scheduled.  Patient verbalized understanding and she will notify her mother of the results.  Mother speaks Spanish and daughter can interpret.        PATHOLOGY RESULTS: BENIGN  Imaging and pathology are concordant.  Benign breast tissue with stromal fibrosis and apocrine metaplasia.     MG MAMMOGRAM DIGITAL DIAGNOSTIC RIGHT: January 19, 2021 - ACCESSION #: [id number]  CC and ML view(s) were taken of the right breast.              ASSESSMENT: Post procedure mammogram for marker placement  RECOMMENDATION: Ultrasound of the right breast in 6 months.

## 2021-02-03 NOTE — Telephone Encounter (Signed)
Called pt, scheduled right breast ultrasound for 76-month follow-up from biopsy 07/25/21 at 2 pm [facility]. Confirmed date time and location. Pt states understands and has no further questions or concerns.

## 2021-05-11 IMAGING — MG DIGITAL SCREENING BILAT W/ TOMO W/ CAD
8 series · 8 of 24 positions shown · non-contrast
Comparison: Previous exam(s).

CLINICAL DATA: Screening.

EXAM:
DIGITAL SCREENING BILATERAL MAMMOGRAM WITH TOMO AND CAD

[L MLO synth-2D]
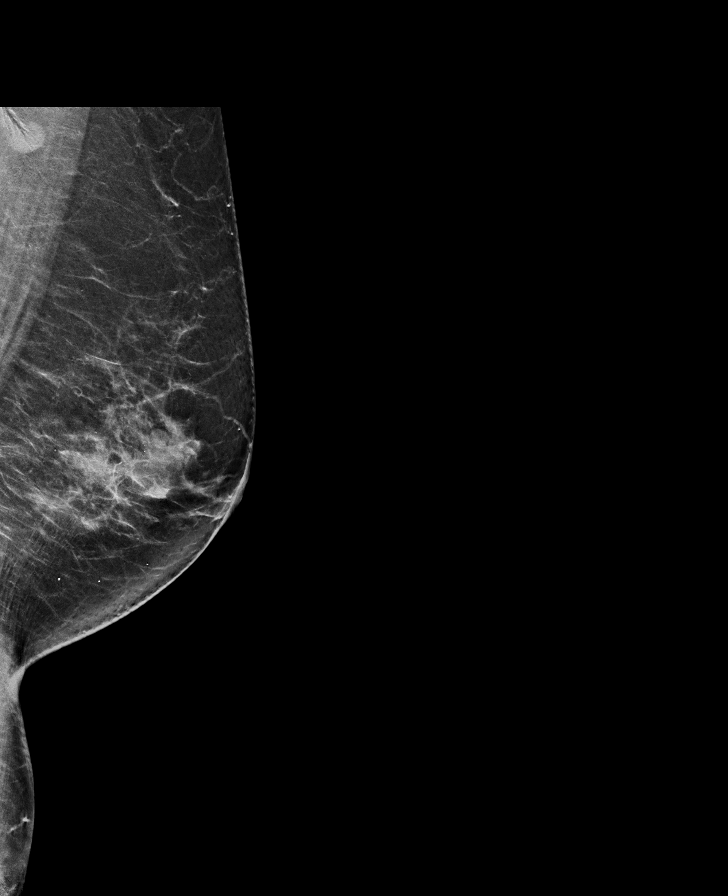

[R CC synth-2D]
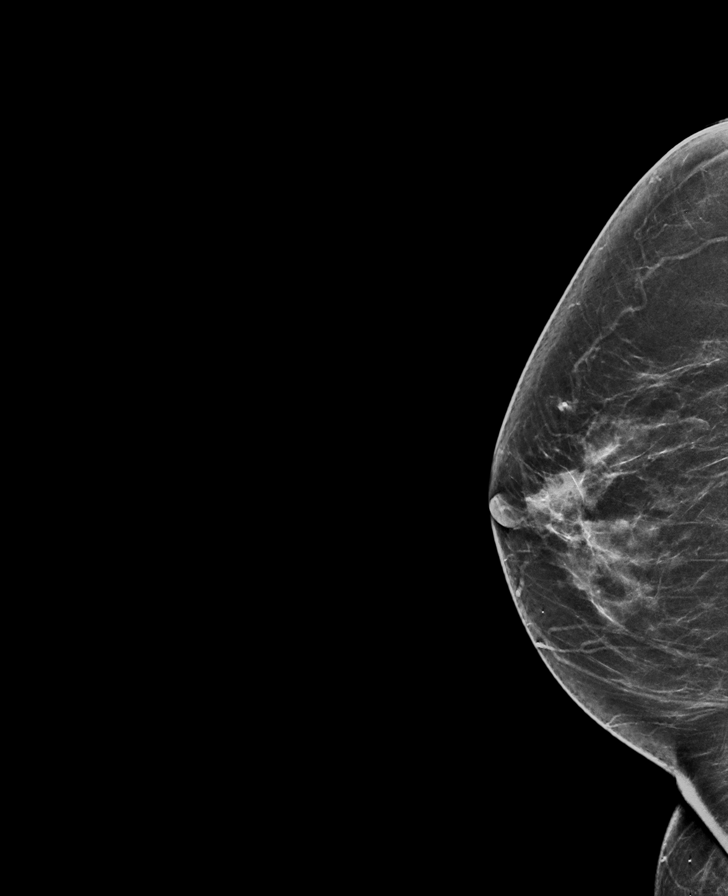

[L CC synth-2D]
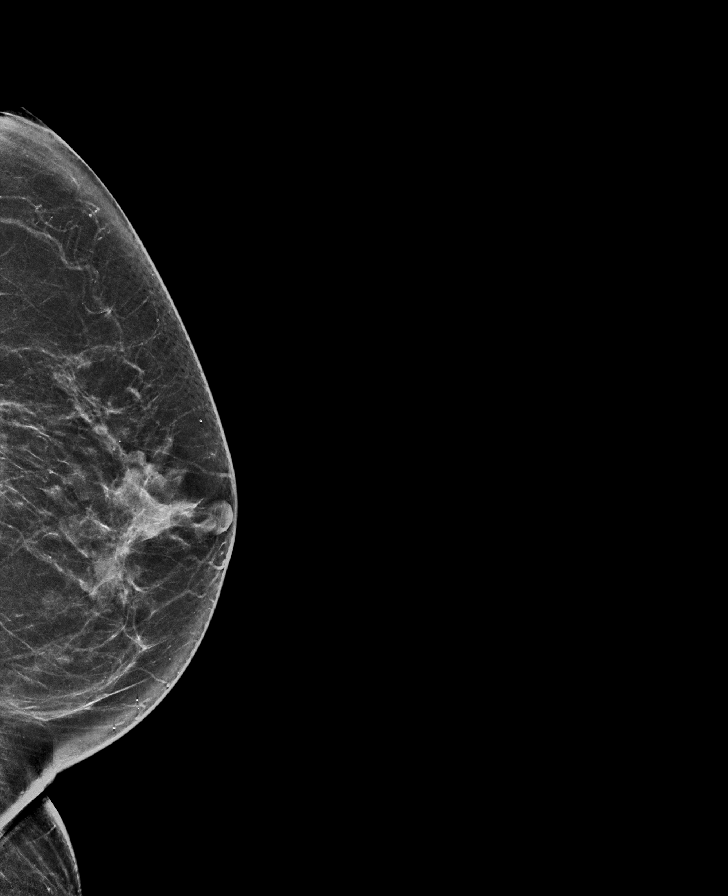

[R MLO synth-2D]
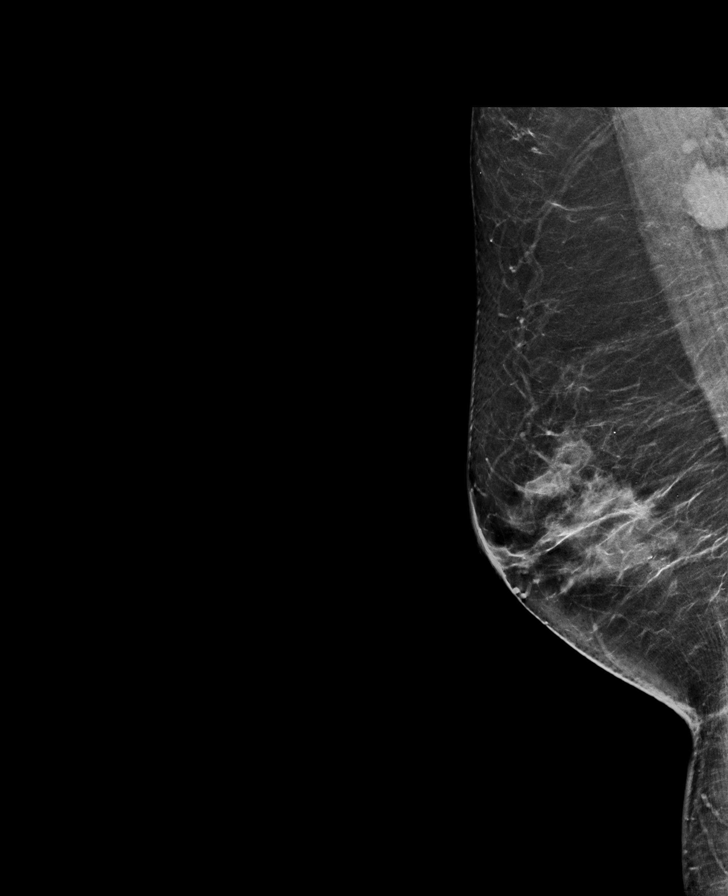

[R CC tomo · tomo slice 34/67.0]
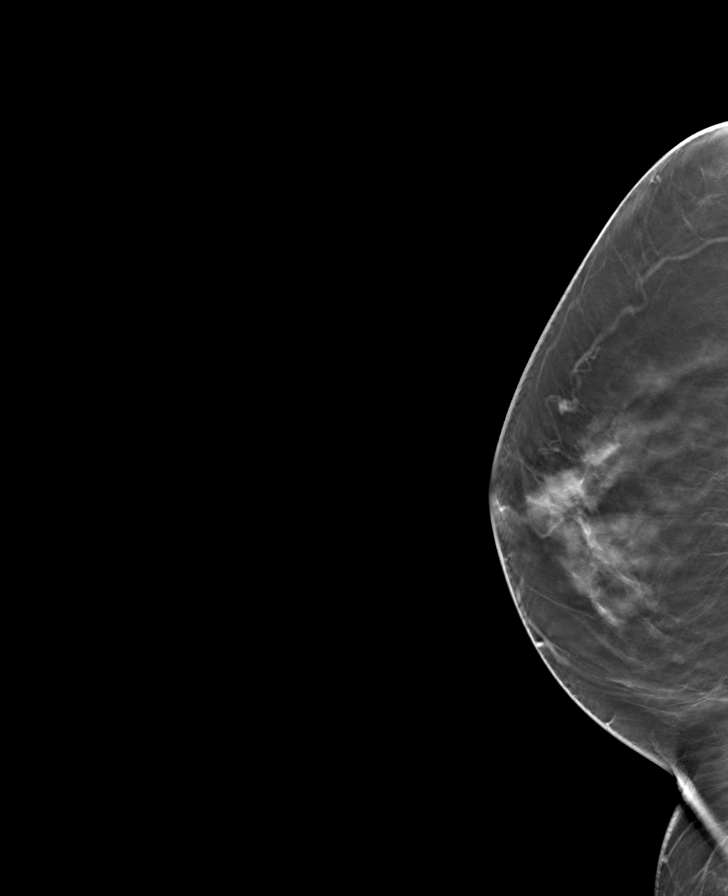

[R MLO tomo · tomo slice 37/73.0]
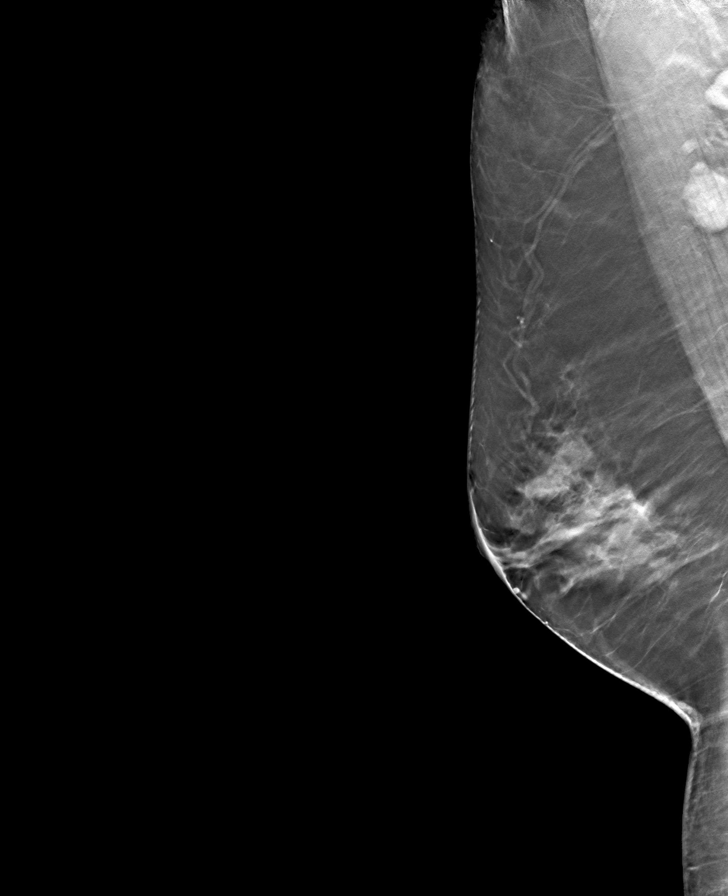

[L CC tomo · tomo slice 33/65.0]
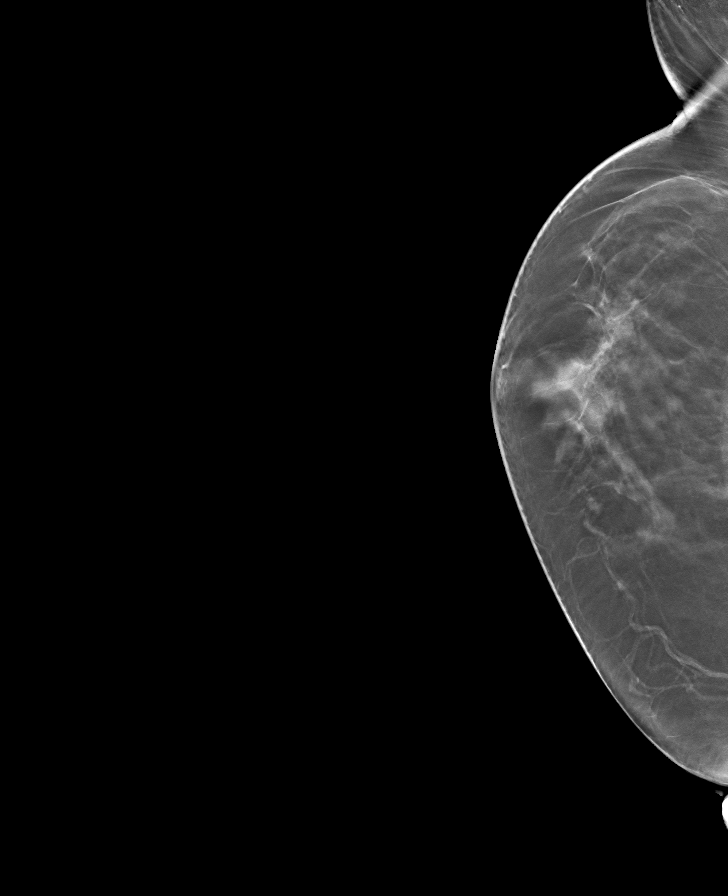

[L MLO tomo · tomo slice 37/72.0]
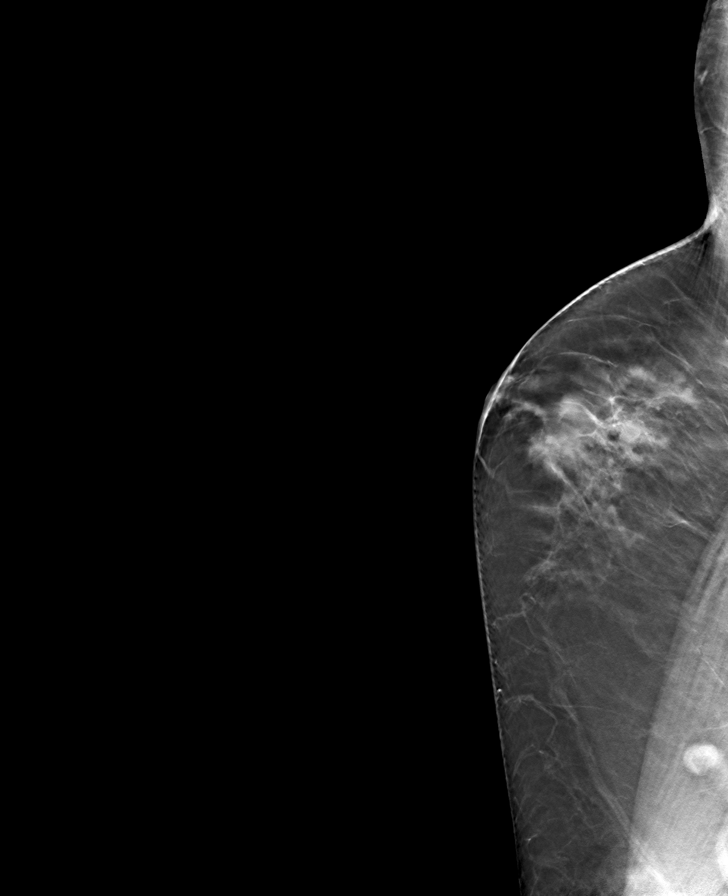

[8 of 24 positions shown; findings below may reference images not displayed]

ACR Breast Density Category b: There are scattered areas of
fibroglandular density.
FINDINGS: There are no findings suspicious for malignancy. Images were
processed with CAD.
IMPRESSION: No mammographic evidence of malignancy. A result letter of this
screening mammogram will be mailed directly to the patient.

RECOMMENDATION:
Screening mammogram in one year. (Code:[id number])

BI-RADS CATEGORY  1: Negative.
